# Patient Record
Sex: Female | Born: 1955 | Race: Black or African American | Hispanic: No | Marital: Married | Smoking: Former smoker
Health system: Southern US, Community
[De-identification: ages and names within clinical notes are randomized; demographics above are authoritative.]

## PROBLEM LIST (undated history)

## (undated) DIAGNOSIS — D126 Benign neoplasm of colon, unspecified: Secondary | ICD-10-CM

## (undated) DIAGNOSIS — T7840XA Allergy, unspecified, initial encounter: Secondary | ICD-10-CM

## (undated) DIAGNOSIS — R7301 Impaired fasting glucose: Secondary | ICD-10-CM

## (undated) DIAGNOSIS — I10 Essential (primary) hypertension: Secondary | ICD-10-CM

## (undated) DIAGNOSIS — E785 Hyperlipidemia, unspecified: Secondary | ICD-10-CM

## (undated) HISTORY — DX: Allergy, unspecified, initial encounter: T78.40XA

## (undated) HISTORY — DX: Benign neoplasm of colon, unspecified: D12.6

## (undated) HISTORY — DX: Hyperlipidemia, unspecified: E78.5

## (undated) HISTORY — DX: Impaired fasting glucose: R73.01

## (undated) HISTORY — DX: Essential (primary) hypertension: I10

---

## 1993-12-23 HISTORY — PX: TUBAL LIGATION: SHX77

## 2000-09-26 ENCOUNTER — Encounter: Payer: Self-pay | Admitting: Family Medicine

## 2000-09-26 ENCOUNTER — Encounter: Admission: RE | Admit: 2000-09-26 | Discharge: 2000-09-26 | Payer: Self-pay | Admitting: Family Medicine

## 2003-10-24 ENCOUNTER — Emergency Department (HOSPITAL_COMMUNITY): Admission: EM | Admit: 2003-10-24 | Discharge: 2003-10-25 | Payer: Self-pay | Admitting: Emergency Medicine

## 2005-03-12 ENCOUNTER — Other Ambulatory Visit: Admission: RE | Admit: 2005-03-12 | Discharge: 2005-03-12 | Payer: Self-pay | Admitting: Family Medicine

## 2007-09-30 ENCOUNTER — Other Ambulatory Visit: Admission: RE | Admit: 2007-09-30 | Discharge: 2007-09-30 | Payer: Self-pay | Admitting: Family Medicine

## 2007-09-30 ENCOUNTER — Ambulatory Visit: Payer: Self-pay | Admitting: Family Medicine

## 2007-10-13 ENCOUNTER — Ambulatory Visit: Payer: Self-pay | Admitting: Family Medicine

## 2007-10-14 ENCOUNTER — Ambulatory Visit: Payer: Self-pay | Admitting: Family Medicine

## 2007-11-03 ENCOUNTER — Ambulatory Visit: Payer: Self-pay | Admitting: Family Medicine

## 2009-04-29 IMAGING — US MAMMO-BILAT-US
1 series · 6 of 6 positions shown · non-contrast
Comparison: NONE

CLINICAL DATA: Ayushmaan Beshra.(CHELSIE)(LIEBER)   Diagnostic Mammogram. 

BILATERAL MAMMOGRAM AND RIGHT BREAST ULTRASOUND

[Series 1: us right breast · 0.11mm/px · 6 of 6 slices shown]
[im 1/6]
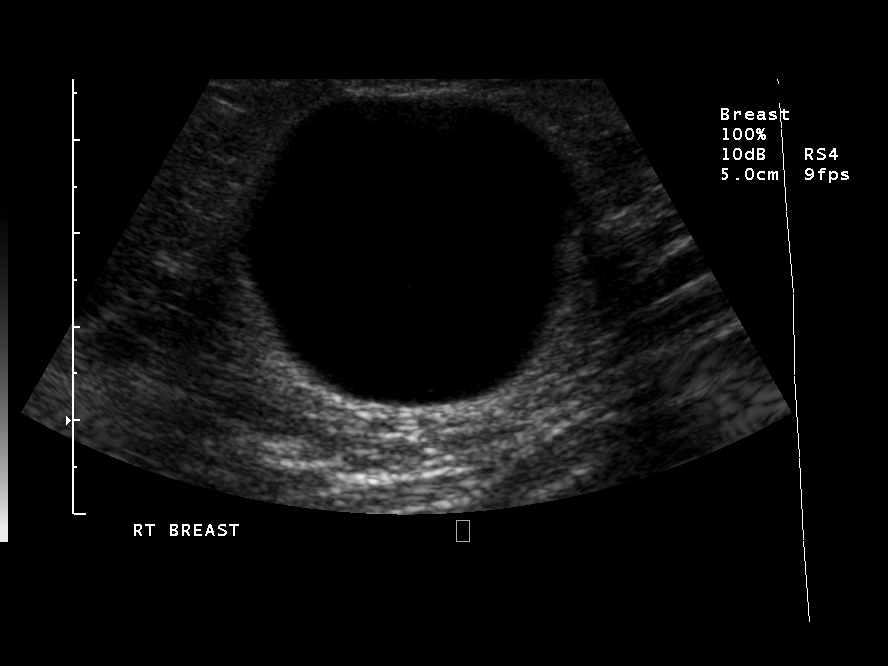
[im 2/6]
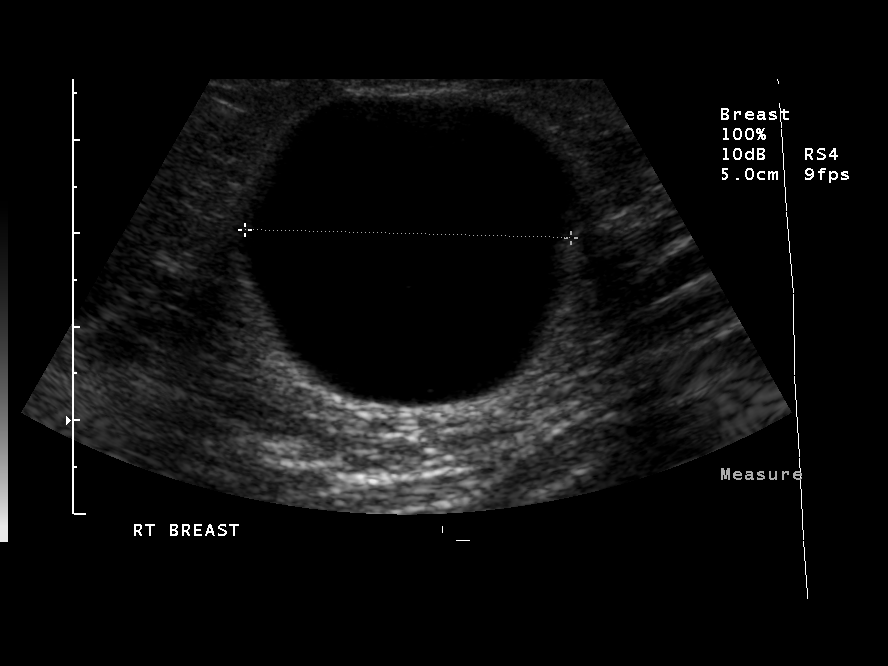
[im 3/6]
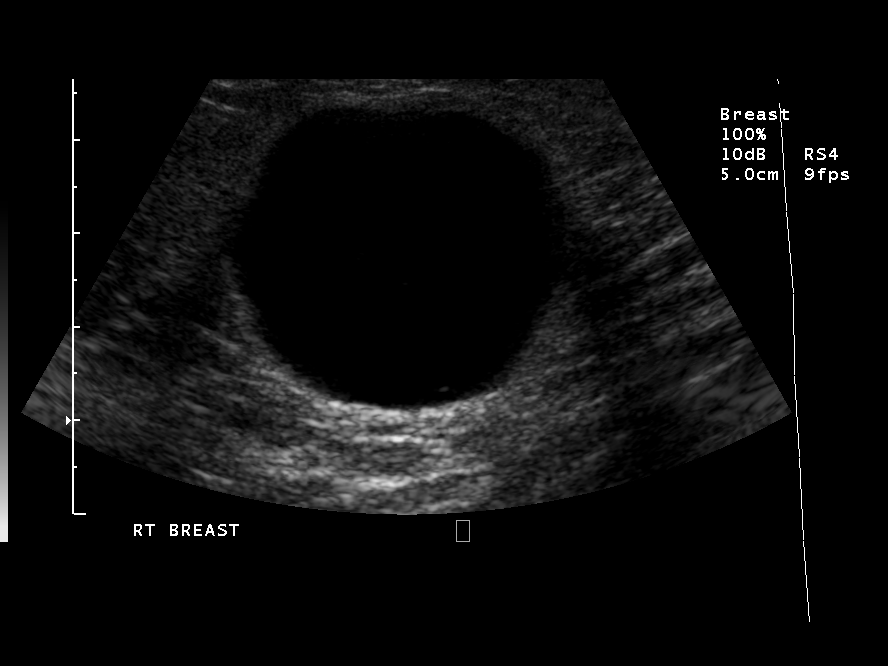
[im 4/6]
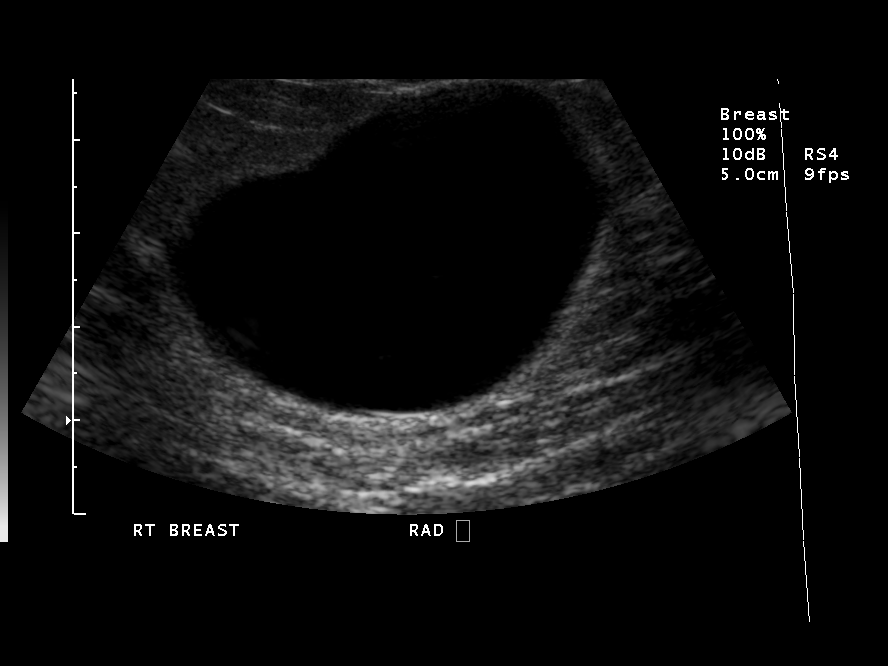
[im 5/6]
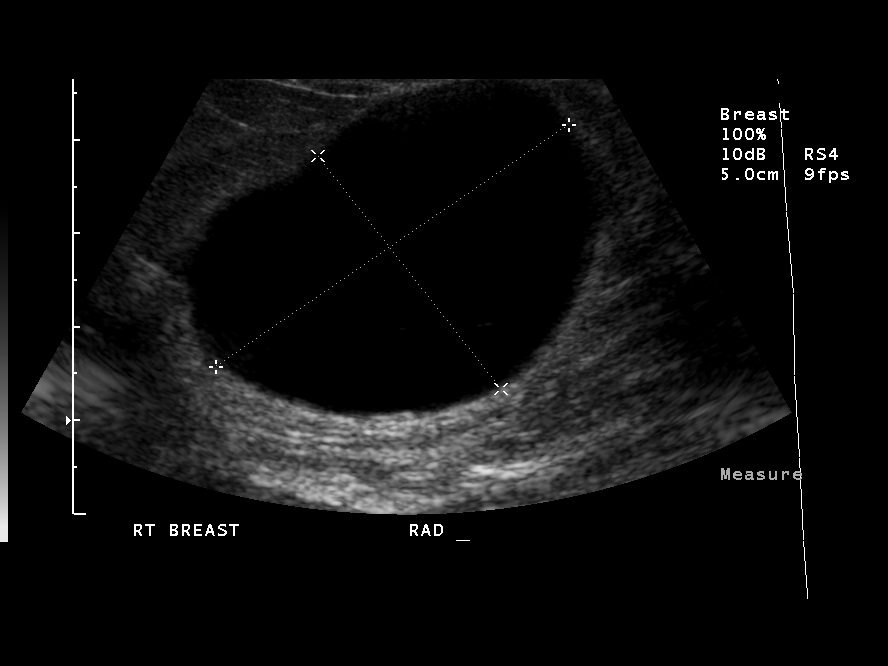
[im 6/6]
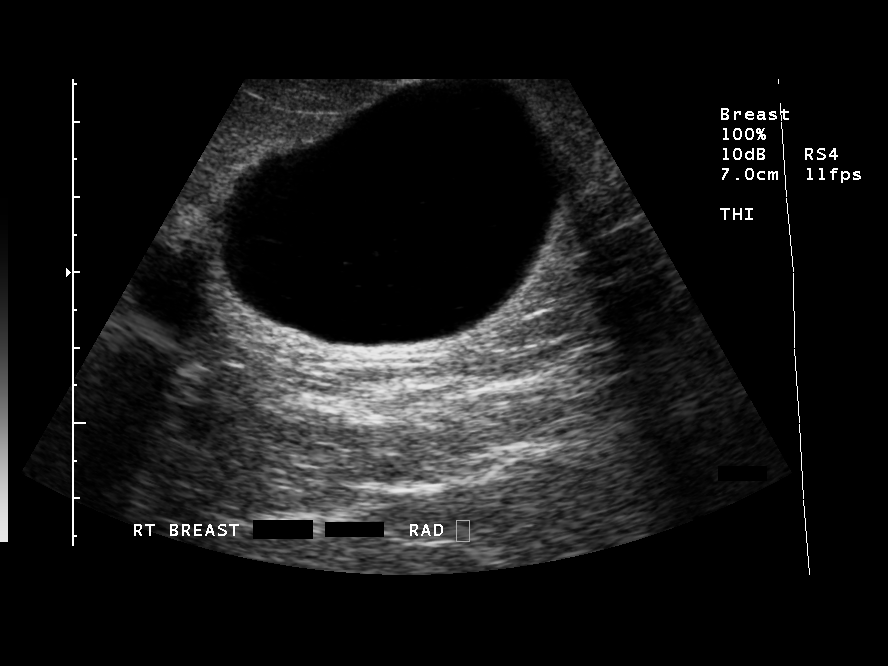

[6 of 6 positions shown; findings below may reference images not displayed]

FINDINGS: There is a large mass in the right breast, increased 
in size from the prior study.  On the current examination this 
lesion measures approximately 4 x 5 cm.  Previously this had a 
maximum diameter of 3.5 cm.  There are multiple bilateral masses, 
varying in size, none of which appear more suspicious at this time 
to warrant biopsy, compared to both 12-02-03 and 12-27-04. Right 
breast ultrasound demonstrates a large cyst measuring 3.2 x 4.6 x 
3.4 cm.  This corresponds to the dominant mass in the right 
breast.
IMPRESSION: Benign changes demonstrated mammographically and by 
ultrasound.  If this lesion continues to enlarge or becomes 
painful, cyst aspiration may aid in further evaluation. The 
patient was informed at the time of the examination of the 
findings and recommendations by verbal and written lay report. 
Computer assisted (Second Look) technology was used as an aid in 
interpretation of this study. BI-RADS 2- Benign Toh, Shakia Date:  10/09/2007 EVA-MAARJA BERGSTEIN

## 2009-06-02 IMAGING — US US MULTISYS EXAM
1 series · 8 of 8 positions shown · non-contrast
Comparison: NONE

CLINICAL DATA: Right breast cyst. 

RIGHT BREAST CYST ASPIRATION

[Series 1: us breast cyst asp · 0.09mm/px · 8 of 8 slices shown]
[im 1/8]
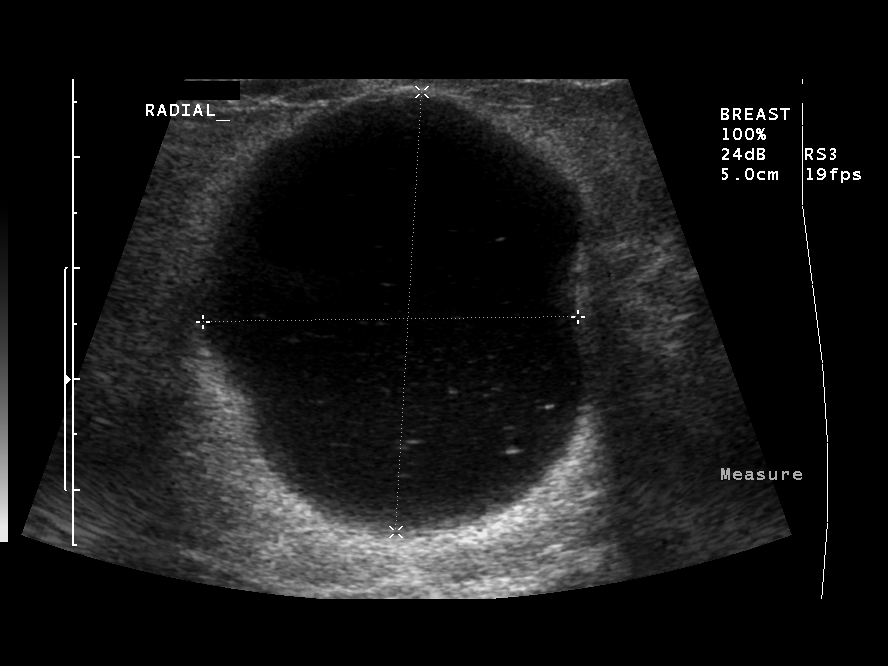
[im 2/8]
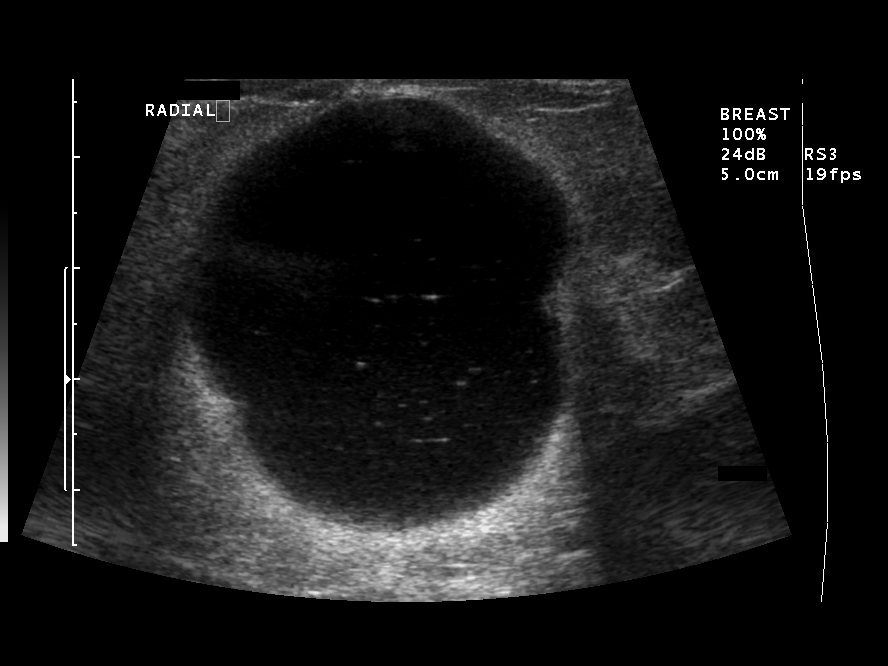
[im 3/8]
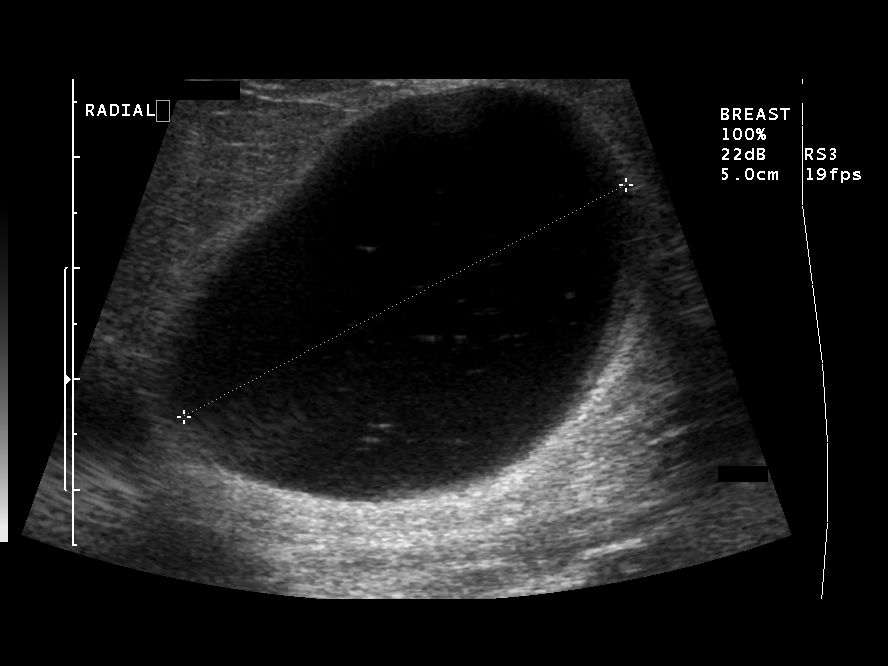
[im 4/8]
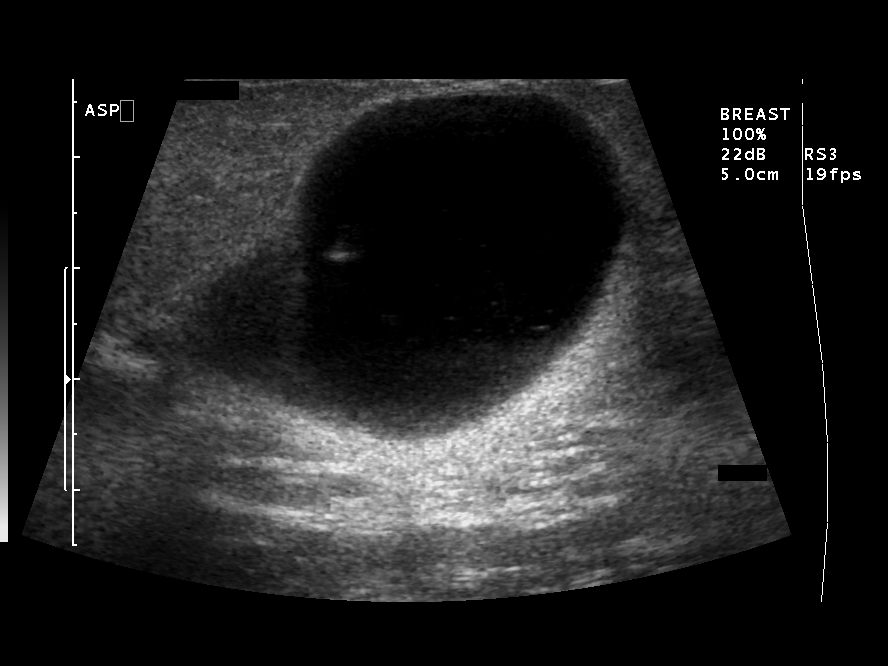
[im 5/8]
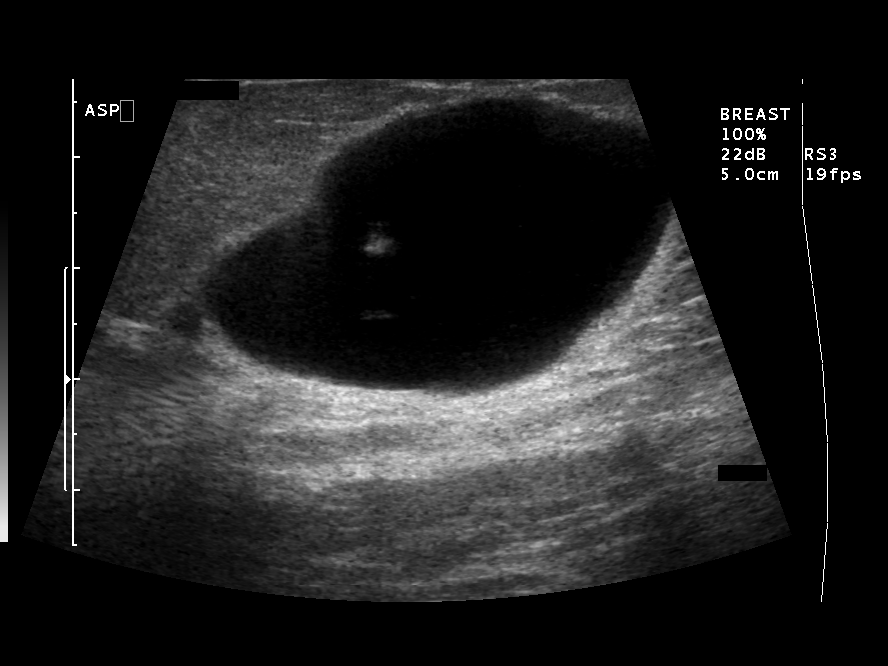
[im 6/8]
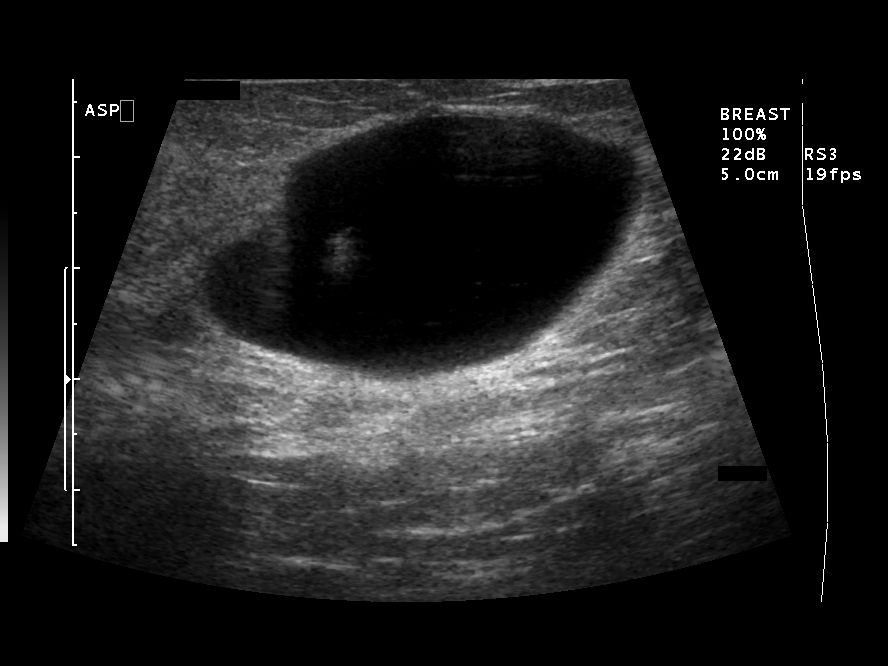
[im 7/8]
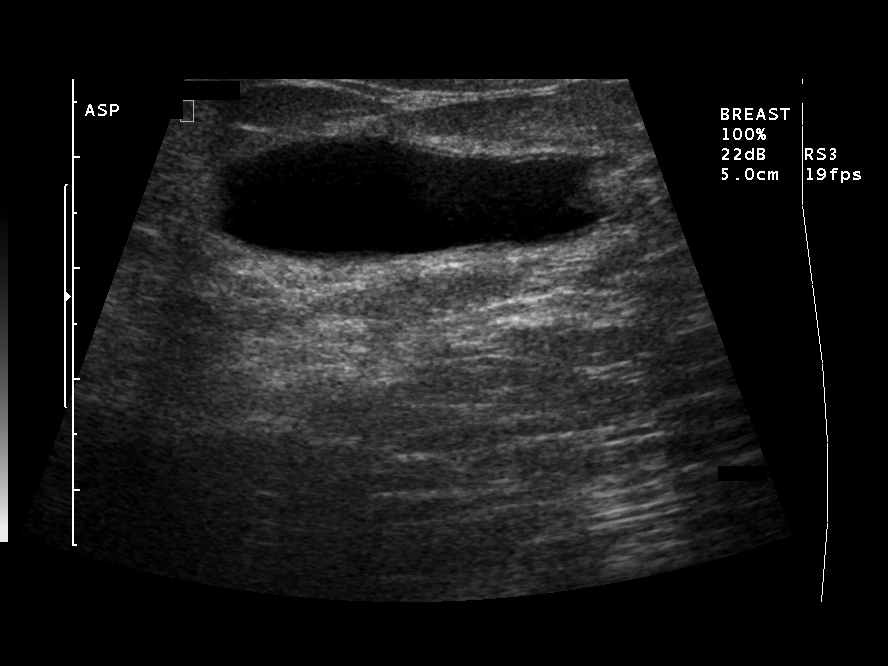
[im 8/8]
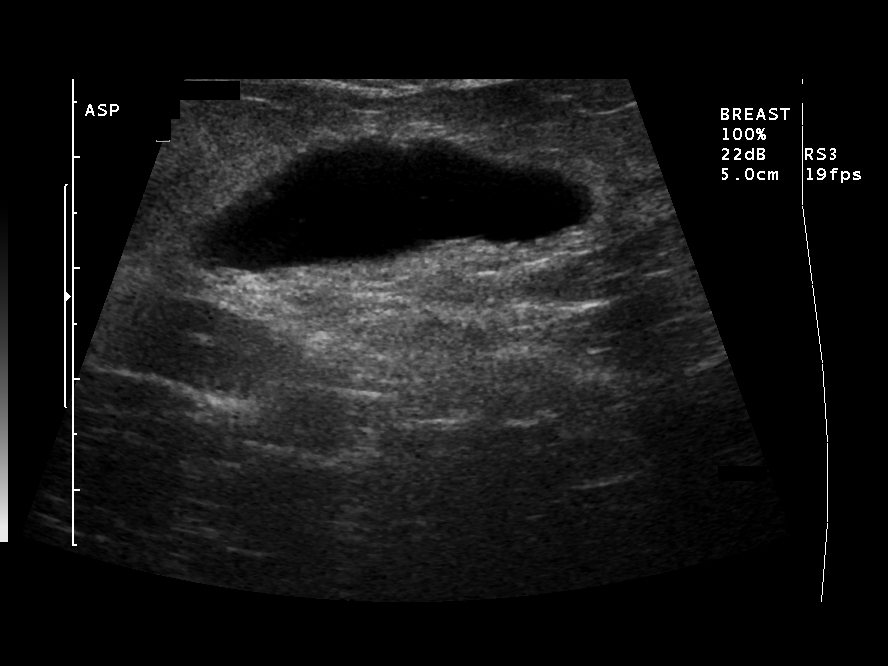

[8 of 8 positions shown; findings below may reference images not displayed]

FINDINGS: The previously identified cyst was again demonstrated 
by ultrasound in the 12 o???clock position of the right breast, 5 cm 
from the nipple. This is readily palpable and measures 3.4 x 4.0 x 
4.5 cm. Low-level dependent echos are consistent with debris. 
Following consent, the skin was cleansed with alcohol wipes, and a 
21-gauge needle was placed within the cyst under ultrasound 
guidance. 20 cc of straw-colored fluid was obtained. There was a 
small amount of residual fluid noted post aspiration. The cyst was 
no longer palpable. There were no complications.
IMPRESSION: Cyst aspiration yielding 20 cc of straw-colored cyst 
compatible fluid. The patient tolerated the procedure well with no 
immediate complications. BI-RADS 2- Benign Norimichi Oami, 
M.D. electronically reviewed on 11/13/2007 Dict Date: 11/11/2007  
Tran Date: 11/13/2007 CAV  [REDACTED]

## 2009-10-12 ENCOUNTER — Ambulatory Visit: Payer: Self-pay | Admitting: Family Medicine

## 2009-10-12 ENCOUNTER — Other Ambulatory Visit: Admission: RE | Admit: 2009-10-12 | Discharge: 2009-10-12 | Payer: Self-pay | Admitting: Family Medicine

## 2009-12-23 DIAGNOSIS — D126 Benign neoplasm of colon, unspecified: Secondary | ICD-10-CM

## 2009-12-23 HISTORY — PX: COLONOSCOPY: SHX174

## 2009-12-23 HISTORY — DX: Benign neoplasm of colon, unspecified: D12.6

## 2009-12-23 LAB — HM COLONOSCOPY: HM Colonoscopy: NORMAL

## 2010-03-14 ENCOUNTER — Ambulatory Visit: Payer: Self-pay | Admitting: Family Medicine

## 2011-01-09 ENCOUNTER — Ambulatory Visit: Admit: 2011-01-09 | Payer: Self-pay | Admitting: Family Medicine

## 2011-01-30 ENCOUNTER — Other Ambulatory Visit: Payer: Self-pay | Admitting: Family Medicine

## 2011-01-30 ENCOUNTER — Encounter (INDEPENDENT_AMBULATORY_CARE_PROVIDER_SITE_OTHER): Payer: BC Managed Care – PPO | Admitting: Physician Assistant

## 2011-01-30 ENCOUNTER — Other Ambulatory Visit (HOSPITAL_COMMUNITY)
Admission: RE | Admit: 2011-01-30 | Discharge: 2011-01-30 | Disposition: A | Discharge status: Home / Self Care | Payer: BC Managed Care – PPO | Source: Ambulatory Visit | Attending: Family Medicine | Admitting: Family Medicine

## 2011-01-30 DIAGNOSIS — Z124 Encounter for screening for malignant neoplasm of cervix: Secondary | ICD-10-CM | POA: Insufficient documentation

## 2011-01-30 DIAGNOSIS — E119 Type 2 diabetes mellitus without complications: Secondary | ICD-10-CM

## 2011-01-30 DIAGNOSIS — Z0000 Encounter for general adult medical examination without abnormal findings: Secondary | ICD-10-CM

## 2011-01-30 DIAGNOSIS — E7800 Pure hypercholesterolemia, unspecified: Secondary | ICD-10-CM

## 2011-01-30 LAB — HM PAP SMEAR: HM Pap smear: NORMAL

## 2011-03-04 ENCOUNTER — Ambulatory Visit (INDEPENDENT_AMBULATORY_CARE_PROVIDER_SITE_OTHER): Payer: BC Managed Care – PPO | Admitting: Family Medicine

## 2011-03-04 DIAGNOSIS — E7800 Pure hypercholesterolemia, unspecified: Secondary | ICD-10-CM

## 2011-03-04 DIAGNOSIS — R030 Elevated blood-pressure reading, without diagnosis of hypertension: Secondary | ICD-10-CM

## 2011-05-23 ENCOUNTER — Encounter: Payer: Self-pay | Admitting: *Deleted

## 2011-05-27 ENCOUNTER — Ambulatory Visit: Payer: BC Managed Care – PPO

## 2011-05-30 ENCOUNTER — Ambulatory Visit: Payer: BC Managed Care – PPO | Admitting: Family Medicine

## 2011-10-25 ENCOUNTER — Telehealth: Payer: Self-pay | Admitting: *Deleted

## 2011-10-25 NOTE — Telephone Encounter (Signed)
Patient dropped off form to be filled out by Dr.Knapp. Called patient this am to let her know that she was last seen 02/2011. She was supposed to follow up in 2-3 months to have BP rechecked(was high at visit) and also needs a statin panel(lipid,liver) either prior to appointment, if she is unable than she can just schedule a fasting appt. Message left.

## 2011-11-04 ENCOUNTER — Encounter: Payer: Self-pay | Admitting: Family Medicine

## 2011-11-04 ENCOUNTER — Ambulatory Visit (INDEPENDENT_AMBULATORY_CARE_PROVIDER_SITE_OTHER): Payer: BC Managed Care – PPO | Admitting: Family Medicine

## 2011-11-04 DIAGNOSIS — R030 Elevated blood-pressure reading, without diagnosis of hypertension: Secondary | ICD-10-CM

## 2011-11-04 DIAGNOSIS — E119 Type 2 diabetes mellitus without complications: Secondary | ICD-10-CM | POA: Insufficient documentation

## 2011-11-04 DIAGNOSIS — E7800 Pure hypercholesterolemia, unspecified: Secondary | ICD-10-CM

## 2011-11-04 DIAGNOSIS — IMO0001 Reserved for inherently not codable concepts without codable children: Secondary | ICD-10-CM | POA: Insufficient documentation

## 2011-11-04 LAB — COMPREHENSIVE METABOLIC PANEL
ALT: 15 U/L (ref 0–35)
AST: 16 U/L (ref 0–37)
Albumin: 4.1 g/dL (ref 3.5–5.2)
Alkaline Phosphatase: 83 U/L (ref 39–117)
BUN: 15 mg/dL (ref 6–23)
CO2: 26 mEq/L (ref 19–32)
Calcium: 9.6 mg/dL (ref 8.4–10.5)
Chloride: 105 mEq/L (ref 96–112)
Creat: 0.96 mg/dL (ref 0.50–1.10)
Glucose, Bld: 96 mg/dL (ref 70–99)
Potassium: 4 mEq/L (ref 3.5–5.3)
Sodium: 140 mEq/L (ref 135–145)
Total Bilirubin: 0.4 mg/dL (ref 0.3–1.2)
Total Protein: 6.7 g/dL (ref 6.0–8.3)

## 2011-11-04 LAB — LIPID PANEL
Cholesterol: 192 mg/dL (ref 0–200)
HDL: 55 mg/dL (ref >39)
LDL Cholesterol: 121 mg/dL — ABNORMAL HIGH (ref 0–99)
Total CHOL/HDL Ratio: 3.5 Ratio
Triglycerides: 81 mg/dL (ref <150)
VLDL: 16 mg/dL (ref 0–40)

## 2011-11-04 NOTE — Patient Instructions (Signed)
Add some additional cardio--goal of 30-60 minutes of aerobic activity 5-6 days/week.  Low sodium diet:  2 Gram Low Sodium Diet A 2 gram sodium diet restricts the amount of sodium in the diet to no more than 2 g or 2000 mg daily. Limiting the amount of sodium is often used to help lower blood pressure. It is important if you have heart, liver, or kidney problems. Many foods contain sodium for flavor and sometimes as a preservative. When the amount of sodium in a diet needs to be low, it is important to know what to look for when choosing foods and drinks. The following includes some information and guidelines to help make it easier for you to adapt to a low sodium diet. QUICK TIPS  Do not add salt to food.   Avoid convenience items and fast food.   Choose unsalted snack foods.   Buy lower sodium products, often labeled as "lower sodium" or "no salt added."   Check food labels to learn how much sodium is in 1 serving.   When eating at a restaurant, ask that your food be prepared with less salt or none, if possible.  READING FOOD LABELS FOR SODIUM INFORMATION The nutrition facts label is a good place to find how much sodium is in foods. Look for products with no more than 500 to 600 mg of sodium per meal and no more than 150 mg per serving. Remember that 2 g = 2000 mg. The food label may also list foods as:  Sodium-free: Less than 5 mg in a serving.   Very low sodium: 35 mg or less in a serving.   Low-sodium: 140 mg or less in a serving.   Light in sodium: 50% less sodium in a serving. For example, if a food that usually has 300 mg of sodium is changed to become light in sodium, it will have 150 mg of sodium.   Reduced sodium: 25% less sodium in a serving. For example, if a food that usually has 400 mg of sodium is changed to reduced sodium, it will have 300 mg of sodium.  CHOOSING FOODS Grains  Avoid: Salted crackers and snack items. Some cereals, including instant hot cereals.  Bread stuffing and biscuit mixes. Seasoned rice or pasta mixes.   Choose: Unsalted snack items. Low-sodium cereals, oats, puffed wheat and rice, shredded wheat. English muffins and bread. Pasta.  Meats  Avoid: Salted, canned, smoked, spiced, pickled meats, including fish and poultry. Bacon, ham, sausage, cold cuts, hot dogs, anchovies.   Choose: Low-sodium canned tuna and salmon. Fresh or frozen meat, poultry, and fish.  Dairy  Avoid: Processed cheese and spreads. Cottage cheese. Buttermilk and condensed milk. Regular cheese.   Choose: Milk. Low-sodium cottage cheese. Yogurt. Sour cream. Low-sodium cheese.  Fruits and Vegetables  Avoid: Regular canned vegetables. Regular canned tomato sauce and paste. Frozen vegetables in sauces. Olives. Rosita Fire. Relishes. Sauerkraut.   Choose: Low-sodium canned vegetables. Low-sodium tomato sauce and paste. Frozen or fresh vegetables. Fresh and frozen fruit.  Condiments  Avoid: Canned and packaged gravies. Worcestershire sauce. Tartar sauce. Barbecue sauce. Soy sauce. Steak sauce. Ketchup. Onion, garlic, and table salt. Meat flavorings and tenderizers.   Choose: Fresh and dried herbs and spices. Low-sodium varieties of mustard and ketchup. Lemon juice. Tabasco sauce. Horseradish.  SAMPLE 2 GRAM SODIUM MEAL PLAN Breakfast / Sodium (mg)  1 cup low-fat milk / 143 mg   2 slices whole-wheat toast / 270 mg   1 tbs heart-healthy margarine /  153 mg   1 hard-boiled egg / 139 mg   1 small orange / 0 mg  Lunch / Sodium (mg)  1 cup raw carrots / 76 mg    cup hummus / 298 mg   1 cup low-fat milk / 143 mg    cup red grapes / 2 mg   1 whole-wheat pita bread / 356 mg  Dinner / Sodium (mg)  1 cup whole-wheat pasta / 2 mg   1 cup low-sodium tomato sauce / 73 mg   3 oz lean ground beef / 57 mg   1 small side salad (1 cup raw spinach leaves,  cup cucumber,  cup yellow bell pepper) with 1 tsp olive oil and 1 tsp red wine vinegar / 25 mg  Snack  / Sodium (mg)  1 container low-fat vanilla yogurt / 107 mg   3 graham cracker squares / 127 mg  Nutrient Analysis  Calories: 2033   Protein: 77 g   Carbohydrate: 282 g   Fat: 72 g   Sodium: 1971 mg  Document Released: 12/09/2005 Document Revised: 08/21/2011 Document Reviewed: 03/12/2010 [facility] Patient Information 2012 [place], [place].

## 2011-11-04 NOTE — Progress Notes (Signed)
Patient presents to have form completed for work.  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  Last PPD was 2008.  Form doesn't state how often this needs to be tested.  She had elevated BP at last visit.  Checks BP periodically at the [facility].  After exercise, her BP runs 118/82, but usually running 130-140/87 before she works out.  Denies headaches, chest pain, edema (only if she travels).  Admits to being noncompliant with taking her Lipitor.  She has been taking it consistently now for 3 weeks, but prior to that was very inconsistent (taking some days, not others, forgetting when out of town--averaged about 50% of the time taking it).  Denies side effect of medications.  She is fasting today for labs.  She is exercising 4 days/week, and although hasn't lost weight, she has lost inches--now wearing pants a size smaller, and feels like she's lost inches everywhere (not just waist).  She is lifting weights twice a week, and Zumba twice a week.  Past Medical History  Diagnosis Date  . Allergy   . Hyperlipidemia   . Impaired fasting glucose   . Diabetes mellitus 09/2009  . Adenomatous colon polyp 12/2009  . Hypertension borderline bp's/white coat syndrome    Past Surgical History  Procedure Date  . Tubal ligation 1995  . Colonoscopy 12/2009    History   Social History  . Marital Status: Married    Spouse Name: N/A    Number of Children: 2  . Years of Education: N/A   Occupational History  . foster parent    Social History Main Topics  . Smoking status: Former Smoker    Types: Cigarettes    Quit date: 12/24/1991  . Smokeless tobacco: Not on file  . Alcohol Use: Yes     1 drink eery 6 months.  . Drug Use: No  . Sexually Active: Not on file   Other Topics Concern  . Not on file   Social History Narrative   Retired Interior and spatial designer.  Foster parent    No family history on file.  Current outpatient prescriptions:atorvastatin (LIPITOR) 20 MG tablet, Take 20 mg  by mouth daily.  , Disp: , Rfl: ;  Calcium Carbonate-Vitamin D (CALCIUM 500 + D PO), Take 1 tablet by mouth daily.  , Disp: , Rfl: ;  fish oil-omega-3 fatty acids 1000 MG capsule, Take 1 g by mouth daily.  , Disp: , Rfl: ;  Misc Natural Products (PROGESTERONE) 1000 MG/60GM CREA, Apply 1 application topically daily.  , Disp: , Rfl:  Vitamins C E (VITAMIN C & E COMBINATION) 500-400 MG-UNIT CAPS, Take 2 tablets by mouth daily.  , Disp: , Rfl:   Allergies  Allergen Reactions  . Codeine Itching and Other (See Comments)    aggitation.   ROS:  Denies headaches, chest pain, shortness of breath, edema, URI symptoms, fever, GI complaints, GU complaints, joint or muscle pains, skin rash, depression, anxiety or other concerns  PHYSICAL EXAM: BP 146/88  Pulse 76  Ht 5\' 3"  (1.6 m)  Wt 219 lb (99.338 kg)  BMI 38.79 kg/m2 Well developed, pleasant female in no distress HEENT: PERRL, EOMI, conjunctiva clear Neck: no lymphadenopathy or thyromegaly Heart: regular rate and rhythm without murmur Lungs: clear bilaterally Abdomen: soft, nontender Extremities: no edema, 2+ pulses Psych: normal mood, affect, hygiene and grooming  ASSESSMENT/PLAN:  1. Pure hypercholesterolemia  Comprehensive metabolic panel, Lipid panel  2. Type II or unspecified type diabetes mellitus without  mention of complication, not stated as uncontrolled  Comprehensive metabolic panel, Hemoglobin A1c  3. Elevated BP     Hyperlipidemia: noncompliant with meds, only compliant x 3 weeks.  LDL goal <100 due to DM (which is diet controlled).  I don't expect it to be at goal--if it is close (ie 110), then continue 20mg ; if >130, will need dose adjustment.  Not seeing full effect of the 20mg  due to inconsistency in taking  Declines flu shot--strongly encouraged today.  Borderline blood pressure--appears to have white coat component.  Numbers before exercise are borderline, much improved after exercise.  EKG was done at last visit, and no LVH  was found.  Continue with diet/exercise, low sodium diet.  Consider ambulatory BP monitor in future if still borderline.  F/u will depend on lab results--either 3-6 months.  Form was filled out, putting in 2008 PPD results.  She will return for nurse visit if she finds out she needs current one.

## 2011-11-07 ENCOUNTER — Other Ambulatory Visit: Payer: Self-pay | Admitting: *Deleted

## 2011-11-07 DIAGNOSIS — E7800 Pure hypercholesterolemia, unspecified: Secondary | ICD-10-CM

## 2011-12-10 ENCOUNTER — Encounter: Payer: Self-pay | Admitting: Medical

## 2011-12-10 ENCOUNTER — Ambulatory Visit (INDEPENDENT_AMBULATORY_CARE_PROVIDER_SITE_OTHER): Payer: BC Managed Care – PPO | Admitting: Medical

## 2011-12-10 DIAGNOSIS — J111 Influenza due to unidentified influenza virus with other respiratory manifestations: Secondary | ICD-10-CM | POA: Insufficient documentation

## 2011-12-10 MED ORDER — HYDROCODONE-HOMATROPINE 5-1.5 MG/5ML PO SYRP: 5.0000 mL | ORAL_SOLUTION | Freq: Four times a day (QID) | ORAL | Status: AC | PRN

## 2011-12-10 NOTE — Patient Instructions (Signed)
Influenza, Adult Influenza ("the flu") is a viral infection of the respiratory tract. It causes chills, fever, cough, headache, body aches, and sore throat. Influenza in general will make you feel sicker than when you have a common cold. Symptoms of the illness typically last a few days. Cough and fatigue may continue for as long as 7 to 10 days. Influenza is highly contagious. It spreads easily to others in the droplets from coughs and sneezes. People frequently become infected by touching something that was recently contaminated with the virus and then touch their mouth, nose or eyes. This infection is caused by a virus. Symptoms will not be reduced or improved by taking an antibiotic. Antibiotics are medications that kill bacteria, not viruses. DIAGNOSIS  Diagnosis of influenza is often made based on the history and physical examination as well as the presence of influenza reports occurring in your community. Testing can be done if the diagnosis is not certain. TREATMENT  Since influenza is caused by a virus, antibiotics are not helpful. Your caregiver may prescribe antiviral medicines to shorten the illness and lessen the severity. Your caregiver may also recommend influenza vaccination and/or antiviral medicines for your family members in order to prevent the spread of influenza to them. HOME CARE INSTRUCTIONS  DO NOT GIVE ASPIRIN TO PERSONS WITH INFLUENZA WHO ARE UNDER 18 YEARS OF AGE. This could lead to brain and liver damage (Reye's syndrome). Read the label on over-the-counter medicines.   Stay home from work or school if at all possible until most of your symptoms are gone.   Only take over-the-counter or prescription medicines for pain, discomfort, or fever as directed by your caregiver.   Use a cool mist humidifier to increase air moisture. This will make breathing easier.   Rest until your temperature is nearly normal: 98.6 F (37 C). This usually takes 3 to 4 days. Be sure you get  plenty of rest.   Drink at least eight, eight-ounce glasses of fluids per day. Fluids include water, juice, broth, gelatin, or lemonade.   Cover your mouth and nose when coughing or sneezing and wash your hands often to prevent the spread of this virus to other persons.  PREVENTION  Annual influenza vaccination (flu shots) is the best way to avoid getting influenza. An annual flu shot is now routinely recommended for all adults in the [place] SEEK MEDICAL CARE IF:   You develop shortness of breath while resting.   You have a deep cough with production of mucous or chest pain.   You develop nausea (feeling sick to your stomach), vomiting, or diarrhea.  SEEK IMMEDIATE MEDICAL CARE IF:   You have difficulty breathing, become short of breath, or your skin or nails turn bluish.   You develop severe neck pain or stiffness.   You develop a severe headache, facial pain, or earache.   You have a fever.   You develop nausea or vomiting that cannot be controlled.  Document Released: 12/06/2000 Document Revised: 08/21/2011 Document Reviewed: 10/11/2009 [employer] Patient Information 2012 [employer]. 

## 2011-12-10 NOTE — Progress Notes (Signed)
Subjective:   HPI  Katelyn Andrews is a 55 y.o. female who presents with 2 day hx/o cough, sore throat, body aches, chills, fever over 102, no appetite.  +sick contacts.  She notes bad headache.  Denies nausea, vomiting, diarrhea. Drinking lots of fluids, resting.  Using Nyquil and Dayquil.  Denies hx/o asthma.  No other aggravating or relieving factors.  She did not get a flu shot this year.  No other c/o.  The following portions of the patient's history were reviewed and updated as appropriate: allergies, current medications, past family history, past medical history, past social history, past surgical history and problem list.  Past Medical History  Diagnosis Date  . Allergy   . Hyperlipidemia   . Impaired fasting glucose   . Diabetes mellitus 09/2009  . Adenomatous colon polyp 12/2009  . Hypertension borderline bp's/white coat syndrome    Review of Systems Constitutional: +fever, +chills, +sweats, -unexpected -weight change,-fatigue ENT: +runny nose, -ear pain, +sore throat Cardiology:  chest pain, -palpitations, -edema Respiratory: +cough, -shortness of breath, +wheezing Gastroenterology: -abdominal pain, -nausea, -vomiting, -diarrhea, -constipation Hematology: -bleeding or bruising problems Musculoskeletal: -arthralgias, -myalgias, -joint swelling, -back pain Ophthalmology: -vision changes Urology: -dysuria, -difficulty urinating, -hematuria, -urinary frequency, -urgency Neurology: +headache, +weakness, -tingling, -numbness   Objective:     General: Ill-appearing, well-developed, well-nourished, ill appearing Skin: Hot, moist HEENT: Nose inflamed and congested, clear conjunctiva, TMs pearly, mild sinus tenderness, pharynx with erythema, no exudates Neck: Supple, nontender, shotty cervical adenopathy Heart: Regular rate and rhythm, normal S1, S2, no murmurs Lungs: Clear to auscultation bilaterally, no wheezes, rales, rhonchi Abdomen: Nontender non distended Extremities:  Mild generalized tenderness      Assessment and Plan:    Encounter Diagnosis  Name Primary?  . Influenza Yes   Exam and symptoms suggest influenza given community prevalence at current.  Discussed supportive care, rest, hydration, Ibuprofen for fever/aches, salt water gargles and nasal saline, discussed prevention, possible complications.  Gave note for work to be out through Friday possibly if not improved and if fever persists.   Advised flu shot next year.

## 2012-05-26 ENCOUNTER — Encounter: Payer: Self-pay | Admitting: Family Medicine

## 2012-05-26 ENCOUNTER — Ambulatory Visit (INDEPENDENT_AMBULATORY_CARE_PROVIDER_SITE_OTHER): Payer: BC Managed Care – PPO | Admitting: Family Medicine

## 2012-05-26 VITALS — BP 136/86 | HR 68 | Wt 216.0 lb

## 2012-05-26 DIAGNOSIS — H8309 Labyrinthitis, unspecified ear: Secondary | ICD-10-CM

## 2012-05-26 NOTE — Patient Instructions (Signed)
The symptoms should slowly go away over the next 2 weeks but if you have further trouble, call me

## 2012-05-26 NOTE — Progress Notes (Signed)
  Subjective:    Patient ID: Katelyn Andrews, female    DOB: 12/16/1956, 56 y.o.   MRN: [id number]  HPI She has a two-week history of difficulty with intermittent brief thinning sensation. It usually lasts just a second or two. She notices this especially when she tips her head back or to the left. She did have URI symptoms approximately 2 weeks before the symptoms started. Presently she has not had fever, chills, cough, congestion, sore throat.   Review of Systems     Objective:   Physical Exam alert and in no distress. EOMI. Cerebellar testing normal. Tympanic membranes and canals are normal. Throat is clear. Tonsils are normal. Neck is supple without adenopathy or thyromegaly. Cardiac exam shows a regular sinus rhythm without murmurs or gallops. Lungs are clear to auscultation.        Assessment & Plan:   1. Viral labyrinthitis    I reassured her that this was nothing significant. Did not recommend any medications since she is having only short bursts of dizziness. She will call if she has further trouble.

## 2013-03-05 ENCOUNTER — Encounter: Payer: Self-pay | Admitting: Internal Medicine

## 2013-03-17 ENCOUNTER — Encounter: Payer: BC Managed Care – PPO | Admitting: Family Medicine

## 2013-03-17 ENCOUNTER — Ambulatory Visit (INDEPENDENT_AMBULATORY_CARE_PROVIDER_SITE_OTHER): Payer: BC Managed Care – PPO | Admitting: Family Medicine

## 2013-03-17 ENCOUNTER — Encounter: Payer: Self-pay | Admitting: Family Medicine

## 2013-03-17 VITALS — BP 108/72 | HR 64 | Ht 64.0 in | Wt 224.0 lb

## 2013-03-17 DIAGNOSIS — E119 Type 2 diabetes mellitus without complications: Secondary | ICD-10-CM

## 2013-03-17 DIAGNOSIS — Z0000 Encounter for general adult medical examination without abnormal findings: Secondary | ICD-10-CM

## 2013-03-17 DIAGNOSIS — E7800 Pure hypercholesterolemia, unspecified: Secondary | ICD-10-CM

## 2013-03-17 DIAGNOSIS — R5381 Other malaise: Secondary | ICD-10-CM

## 2013-03-17 DIAGNOSIS — Z23 Encounter for immunization: Secondary | ICD-10-CM

## 2013-03-17 LAB — POCT URINALYSIS DIPSTICK
Glucose, UA: NEGATIVE
Ketones, UA: NEGATIVE
Leukocytes, UA: NEGATIVE
Spec Grav, UA: <=1.005
Urobilinogen, UA: NEGATIVE

## 2013-03-17 LAB — COMPREHENSIVE METABOLIC PANEL
ALT: 15 U/L (ref 0–35)
AST: 19 U/L (ref 0–37)
CO2: 30 mEq/L (ref 19–32)
Chloride: 104 mEq/L (ref 96–112)
Creat: 0.97 mg/dL (ref 0.50–1.10)
Sodium: 142 mEq/L (ref 135–145)
Total Bilirubin: 0.4 mg/dL (ref 0.3–1.2)
Total Protein: 6.7 g/dL (ref 6.0–8.3)

## 2013-03-17 LAB — CBC WITH DIFFERENTIAL/PLATELET
Basophils Absolute: 0 10*3/uL (ref 0.0–0.1)
Basophils Relative: 0 % (ref 0–1)
MCHC: 34.1 g/dL (ref 30.0–36.0)
Neutro Abs: 2.2 10*3/uL (ref 1.7–7.7)
Neutrophils Relative %: 33 % — ABNORMAL LOW (ref 43–77)
RDW: 14.8 % (ref 11.5–15.5)
WBC: 6.6 10*3/uL (ref 4.0–10.5)

## 2013-03-17 LAB — LIPID PANEL
Cholesterol: 283 mg/dL — ABNORMAL HIGH (ref 0–200)
HDL: 60 mg/dL (ref >39)
Triglycerides: 113 mg/dL (ref <150)
VLDL: 23 mg/dL (ref 0–40)

## 2013-03-17 LAB — TSH: TSH: 2.532 u[IU]/mL (ref 0.350–4.500)

## 2013-03-17 NOTE — Patient Instructions (Addendum)
HEALTH MAINTENANCE RECOMMENDATIONS:  It is recommended that you get at least 30 minutes of aerobic exercise at least 5 days/week (for weight loss, you may need as much as 60-90 minutes). This can be any activity that gets your heart rate up. This can be divided in 10-15 minute intervals if needed, but try and build up your endurance at least once a week.  Weight bearing exercise is also recommended twice weekly.  Eat a healthy diet with lots of vegetables, fruits and fiber.  "Colorful" foods have a lot of vitamins (ie Natzke vegetables, tomatoes, red peppers, etc).  Limit sweet tea, regular sodas and alcoholic beverages, all of which has a lot of calories and sugar.  Up to 1 alcoholic drink daily may be beneficial for women (unless trying to lose weight, watch sugars).  Drink a lot of water.  Calcium recommendations are 1200-1500 mg daily (1500 mg for postmenopausal women or women without ovaries), and vitamin D 1000 IU daily.  This should be obtained from diet and/or supplements (vitamins), and calcium should not be taken all at once, but in divided doses.  Monthly self breast exams and yearly mammograms for women over the age of 4 is recommended.  Sunscreen of at least SPF 30 should be used on all sun-exposed parts of the skin when outside between the hours of 10 am and 4 pm (not just when at beach or pool, but even with exercise, golf, tennis, and yard work!)  Use a sunscreen that says "broad spectrum" so it covers both UVA and UVB rays, and make sure to reapply every 1-2 hours.  Remember to change the batteries in your smoke detectors when changing your clock times in the spring and fall.  Use your seat belt every time you are in a car, and please drive safely and not be distracted with cell phones and texting while driving.  Remember to get eye exams yearly due to diabetes (even though is just diet-controlled). Schedule your mammogram Colonoscopy due again 2016

## 2013-03-17 NOTE — Progress Notes (Signed)
Chief Complaint  Patient presents with  . Annual Exam    fasting annual exam with pap. No major concerns.    Katelyn Andrews is a 57 y.o. female who presents for a complete physical.  She has the following concerns:  Checks BP's periodically, running 120/80's. Denies headaches or dizziness. Hyperlipidemia--has been off lipitor for quite a few months--told there was a recall by pharmacy (??), reportedly still had refills left.  Following lowfat, low cholesterol diet. DM--diet controlled, never on medication.  Watching carbs/sweets in her diet.  Last ophtho was 2 years ago.    Immunization History  Administered Date(s) Administered  . PPD Test 08/06/2001, 10/13/2007  . Td 04/25/1995, 03/12/2005   Last Pap smear: 2012 Last mammogram: 2012 Last colonoscopy: 2011 Last DEXA: never Ophtho: 2 years ago Dentist: yearly Exercise:  3-4 x/week, walking and zumba, water aerobics  Past Medical History  Diagnosis Date  . Allergy   . Hyperlipidemia   . Impaired fasting glucose   . Diabetes mellitus 09/2009  . Adenomatous colon polyp 12/2009  . Hypertension borderline bp's/white coat syndrome    Past Surgical History  Procedure Laterality Date  . Tubal ligation  1995  . Colonoscopy  12/2009    History   Social History  . Marital Status: Married    Spouse Name: N/A    Number of Children: 2  . Years of Education: N/A   Occupational History  . 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 Smoking status: Former Smoker    Types: Cigarettes    Quit date: 12/24/1991  . Smokeless tobacco: Not on file  . Alcohol Use: Yes     Comment: 1-2 drinks every few months  . Drug Use: No  . Sexually Active: Yes -- Female partner(s)   Other Topics Concern  . 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  Lives with her husband.  Daughter lives in  [place], son in college in [place]    Family History  Problem Relation Age of Onset  . Cancer Maternal Aunt     breast cancer  . Diabetes Maternal Grandmother   . Diabetes Maternal Grandfather     Current outpatient prescriptions:Calcium Carbonate-Vitamin D (CALCIUM 500 + D PO), Take 1 tablet by mouth daily.  , Disp: , Rfl: ;  fish oil-omega-3 fatty acids 1000 MG capsule, Take 1 g by mouth daily.  , Disp: , Rfl: ;  Misc Natural Products (PROGESTERONE) 1000 MG/60GM CREA, Apply 1 application topically daily.  , Disp: , Rfl: ;  Naproxen Sodium (ALEVE PO), Take 2 tablets by mouth as needed., Disp: , Rfl:  Vitamins C E (VITAMIN C & E COMBINATION) 500-400 MG-UNIT CAPS, Take 2 tablets by mouth daily.  , Disp: , Rfl: ;  atorvastatin (LIPITOR) 20 MG tablet, Take 20 mg by mouth daily.  , Disp: , Rfl:   Allergies  Allergen Reactions  . Codeine Itching and Other (See Comments)    aggitation.   ROS:  The patient denies anorexia, fever, headaches,  vision changes, decreased hearing, ear pain, sore throat, breast concerns, chest pain, palpitations, dizziness, syncope, dyspnea on exertion, cough, swelling, nausea, vomiting, diarrhea, constipation, abdominal pain, melena, hematochezia, indigestion/heartburn, hematuria, incontinence, dysuria, vaginal discharge, odor or itch, genital lesions, joint pains, numbness, tingling, weakness, tremor, suspicious skin lesions, depression, anxiety, abnormal bleeding/bruising, or enlarged  lymph nodes.  Slight weight gain--vacations, school, drank a little more beer recently. Postmenopausal (7 years ago)--denies bleeding, spotting.  Hot flashes and vaginal dryness have improved Occasional mild hand pain (relates to doing hair), just intermittent.  PHYSICAL EXAM: BP 108/72  Pulse 64  Ht 5\' 4"  (1.626 m)  Wt 224 lb (101.606 kg)  BMI 38.43 kg/m2  General Appearance:    Alert, cooperative, no distress, appears stated age  Head:    Normocephalic, without obvious  abnormality, atraumatic  Eyes:    PERRL, conjunctiva/corneas clear, EOM's intact, fundi    benign  Ears:    Normal TM's and external ear canals  Nose:   Nares normal, mucosa normal, no drainage or sinus   tenderness  Throat:   Lips, mucosa, and tongue normal; teeth and gums normal  Neck:   Supple, no lymphadenopathy;  thyroid:  no   enlargement/tenderness/nodules; no carotid   bruit or JVD  Back:    Spine nontender, no curvature, ROM normal, no CVA     tenderness  Lungs:     Clear to auscultation bilaterally without wheezes, rales or     ronchi; respirations unlabored  Chest Wall:    No tenderness or deformity   Heart:    Regular rate and rhythm, S1 and S2 normal, no murmur, rub   or gallop  Breast Exam:    No tenderness, masses, or nipple discharge or inversion.      No axillary lymphadenopathy  Abdomen:     Soft, non-tender, nondistended, normoactive bowel sounds,    no masses, no hepatosplenomegaly  Genitalia:    Normal external genitalia without lesions.  BUS and vagina normal; no cervical motion tenderness. No abnormal vaginal discharge.  Uterus and adnexa not enlarged, nontender, no masses.  Pap not performed  Rectal:    Normal tone, no masses or tenderness; guaiac negative stool  Extremities:   No clubbing, cyanosis or edema  Pulses:   2+ and symmetric all extremities  Skin:   Skin color, texture, turgor normal, no rashes or lesions  Lymph nodes:   Cervical, supraclavicular, and axillary nodes normal  Neurologic:   CNII-XII intact, normal strength, sensation and gait; reflexes 2+ and symmetric throughout          Psych:   Normal mood, affect, hygiene and grooming.    ASSESSMENT/PLAN:  Routine general medical examination at a health care facility - Plan: Visual acuity screening, POCT Urinalysis Dipstick  Type II or unspecified type diabetes mellitus without mention of complication, not stated as uncontrolled - Plan: Comprehensive metabolic panel, Hemoglobin A1c, Microalbumin /  creatinine urine ratio  Pure hypercholesterolemia - Plan: Lipid panel  Other malaise and fatigue - Plan: TSH, Vitamin D 25 hydroxy, CBC with Differential  Need for Tdap vaccination - Plan: Tdap vaccine greater than or equal to 7yo IM  Discussed monthly self breast exams and yearly mammograms after the age of 70; at least 30 minutes of aerobic activity at least 5 days/week; proper sunscreen use reviewed; healthy diet, including goals of calcium and vitamin D intake and alcohol recommendations (less than or equal to 1 drink/day) reviewed; regular seatbelt use; changing batteries in smoke detectors.  Immunization recommendations discussed--TdaP today.  Declines pneumovax.  Flu shots yearly.  Colonoscopy recommendations reviewed, UTD, due again 2016  Yearly eye exam due to DM Pap next year (along with HPV) Schedule mammogram Colonoscopy due again in 2016 (h/o adenomatous colon polyps)  Expect high lipids, to restart lipitor, and schedule f/u labs.

## 2013-03-18 ENCOUNTER — Other Ambulatory Visit: Payer: Self-pay | Admitting: *Deleted

## 2013-03-18 DIAGNOSIS — E7800 Pure hypercholesterolemia, unspecified: Secondary | ICD-10-CM

## 2013-03-18 DIAGNOSIS — Z79899 Other long term (current) drug therapy: Secondary | ICD-10-CM

## 2013-03-18 LAB — MICROALBUMIN / CREATININE URINE RATIO
Creatinine, Urine: 22.9 mg/dL
Microalb Creat Ratio: 21.8 mg/g (ref 0.0–30.0)

## 2013-03-18 LAB — VITAMIN D 25 HYDROXY (VIT D DEFICIENCY, FRACTURES): Vit D, 25-Hydroxy: 31 ng/mL (ref 30–89)

## 2013-03-18 MED ORDER — ATORVASTATIN CALCIUM 20 MG PO TABS: 20.0000 mg | ORAL_TABLET | Freq: Every day | ORAL | Status: DC

## 2013-06-03 ENCOUNTER — Other Ambulatory Visit: Payer: Self-pay

## 2013-06-17 ENCOUNTER — Other Ambulatory Visit: Payer: BC Managed Care – PPO

## 2013-06-17 DIAGNOSIS — Z79899 Other long term (current) drug therapy: Secondary | ICD-10-CM

## 2013-06-17 DIAGNOSIS — E7800 Pure hypercholesterolemia, unspecified: Secondary | ICD-10-CM

## 2013-06-17 LAB — LIPID PANEL
Cholesterol: 209 mg/dL — ABNORMAL HIGH (ref 0–200)
Triglycerides: 114 mg/dL (ref <150)

## 2013-06-17 LAB — HEPATIC FUNCTION PANEL
ALT: 18 U/L (ref 0–35)
AST: 19 U/L (ref 0–37)
Alkaline Phosphatase: 74 U/L (ref 39–117)
Indirect Bilirubin: 0.3 mg/dL (ref 0.0–0.9)
Total Protein: 6.3 g/dL (ref 6.0–8.3)

## 2013-10-27 ENCOUNTER — Encounter: Payer: Self-pay | Admitting: Family Medicine

## 2013-11-15 ENCOUNTER — Telehealth: Payer: Self-pay | Admitting: Family Medicine

## 2013-11-15 NOTE — Telephone Encounter (Signed)
Tried to call pt all contact numbers are invalid. Pt needs to schedule a appt per Dr. Lynelle Doctor.

## 2014-07-18 ENCOUNTER — Ambulatory Visit (INDEPENDENT_AMBULATORY_CARE_PROVIDER_SITE_OTHER): Payer: BC Managed Care – PPO | Admitting: Family Medicine

## 2014-07-18 ENCOUNTER — Encounter: Payer: Self-pay | Admitting: Family Medicine

## 2014-07-18 VITALS — BP 128/78 | HR 76 | Ht 64.0 in | Wt 224.0 lb

## 2014-07-18 DIAGNOSIS — E7800 Pure hypercholesterolemia, unspecified: Secondary | ICD-10-CM

## 2014-07-18 DIAGNOSIS — E041 Nontoxic single thyroid nodule: Secondary | ICD-10-CM

## 2014-07-18 DIAGNOSIS — E119 Type 2 diabetes mellitus without complications: Secondary | ICD-10-CM

## 2014-07-18 LAB — COMPREHENSIVE METABOLIC PANEL
ALBUMIN: 4.2 g/dL (ref 3.5–5.2)
ALT: 14 U/L (ref 0–35)
AST: 15 U/L (ref 0–37)
Alkaline Phosphatase: 80 U/L (ref 39–117)
BUN: 19 mg/dL (ref 6–23)
CALCIUM: 9.7 mg/dL (ref 8.4–10.5)
CHLORIDE: 106 meq/L (ref 96–112)
CO2: 27 meq/L (ref 19–32)
CREATININE: 0.89 mg/dL (ref 0.50–1.10)
GLUCOSE: 95 mg/dL (ref 70–99)
POTASSIUM: 4.1 meq/L (ref 3.5–5.3)
Sodium: 141 mEq/L (ref 135–145)
Total Bilirubin: 0.3 mg/dL (ref 0.2–1.2)
Total Protein: 6.1 g/dL (ref 6.0–8.3)

## 2014-07-18 LAB — LIPID PANEL
CHOLESTEROL: 235 mg/dL — AB (ref 0–200)
HDL: 59 mg/dL (ref >39)
LDL Cholesterol: 155 mg/dL — ABNORMAL HIGH (ref 0–99)
TRIGLYCERIDES: 103 mg/dL (ref <150)
Total CHOL/HDL Ratio: 4 Ratio
VLDL: 21 mg/dL (ref 0–40)

## 2014-07-18 LAB — TSH: TSH: 2.18 u[IU]/mL (ref 0.350–4.500)

## 2014-07-18 LAB — HEMOGLOBIN A1C
HEMOGLOBIN A1C: 6.2 % — AB (ref <5.7)
Mean Plasma Glucose: 131 mg/dL — ABNORMAL HIGH (ref <117)

## 2014-07-18 MED ORDER — ATORVASTATIN CALCIUM 20 MG PO TABS: 20.0000 mg | ORAL_TABLET | Freq: Every day | ORAL | Status: DC

## 2014-07-18 NOTE — Progress Notes (Signed)
Chief Complaint  Patient presents with  . Hyperlipidemia    fasting med check.    She is moving to [place], [place] next week.  Mammogram is scheduled for next week. She presents for follow up on cholesterol and blood sugars, and get refills prior to moving. She doesn't have any specific complaints.  She remains very active, exercising regularly, but not losing any weight.  Hyperlipidemia follow-up:  Patient is reportedly following a low-fat, low cholesterol diet.  Compliant with medications and denies medication side effects.  DM--diet controlled, never on medication (diagnosed based on A1c of 6.5 in the past). Watching carbs/sweets in her diet. Last ophtho was April 2014. Denies polydipsia or polyuria.  Her weight has been stable (gains some in the summer, loses in the winter).  Walks 3-4 days/week, and does Zumba 2x/wk.  Snacks on nuts  Past Medical History  Diagnosis Date  . Allergy   . Hyperlipidemia   . Impaired fasting glucose   . Diabetes mellitus 09/2009  . Adenomatous colon polyp 12/2009  . Hypertension borderline bp's/white coat syndrome   Past Surgical History  Procedure Laterality Date  . Tubal ligation  1995  . Colonoscopy  12/2009   History   Social History  . Marital Status: Married    Spouse Name: N/A    Number of Children: 2  . Years of Education: N/A   Occupational History  . 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 Smoking status: Former Smoker    Types: Cigarettes    Quit date: 12/24/1991  . Smokeless tobacco: Not on file  . Alcohol Use: Yes     Comment: 1-2 drinks every few months  . Drug Use: No  . Sexual Activity: Yes    Partners: Male   Other Topics Concern  . Not on file   Social History Narrative   Retired Theme park manager.  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  Lives with her husband.  Daughter lives in [place], son in college in [place] to  [place], [place] in August 2015    Outpatient Encounter Prescriptions as of 07/18/2014  Medication Sig  . atorvastatin (LIPITOR) 20 MG tablet Take 1 tablet (20 mg total) by mouth daily.  . Calcium Carbonate-Vitamin D (CALCIUM 500 + D PO) Take 1 tablet by mouth daily.    . fish oil-omega-3 fatty acids 1000 MG capsule Take 1 g by mouth daily.    . Misc Natural Products (PROGESTERONE) 1000 MG/60GM CREA Apply 1 application topically daily.    . Multiple Vitamin (MULTIVITAMIN) tablet Take 1 tablet by mouth daily.  . [DISCONTINUED] atorvastatin (LIPITOR) 20 MG tablet Take 1 tablet (20 mg total) by mouth daily.  . [DISCONTINUED] Vitamins C E (VITAMIN C & E COMBINATION) 500-400 MG-UNIT CAPS Take 2 tablets by mouth daily.    . Naproxen Sodium (ALEVE PO) Take 2 tablets by mouth as needed.    Allergies  Allergen Reactions  . Codeine Itching and Other (See Comments)    aggitation.   ROS: denies headaches, dizziness, URI symptoms, chest pain, palpitations, nausea, vomiting, abdominal pain, diarrhea, bowel changes, urinary complaints, bleeding, bruising, rashes.  Some edema today due to long car ride (just got back from [place] this morning). Denies depression, anxiety, insomnia. Denies hair/skin/bowel changes.  PHYSICAL EXAM: BP 128/78  Pulse 76  Ht $R'5\' 4"'YT$  (1.626 m)  Wt 224 lb (101.606 kg)  BMI 38.43 kg/m2  Well developed, pleasant, obese female in no distress HEENT:  PERRL, EOMI, conjunctiva clear.  OP clear Neck: no lymphadenopathy.  Thyroid--palpable nodule on right side noted, nontender Heart: regular rate and rhythm without murmur Lungs: clear bilaterally Back: no CVA tenderness Abdomen: soft, nontender, no organomegaly or mass Extremities: no edema, 2+ pulse.  Normal monofilament exam. Neuro: alert and oriented.  Cranial nerves intact. Normal strength, gait Psych: normal mood, affect, hygiene and grooming Skin: no lesions/rashes  ASSESSMENT/PLAN:  Pure hypercholesterolemia - continue low  cholesterol, lowfat diet.  - Plan: Lipid panel, Comprehensive metabolic panel, atorvastatin (LIPITOR) 20 MG tablet  Type II or unspecified type diabetes mellitus without mention of complication, not stated as uncontrolled - diet controlled (never on meds).  continue exercise; weight loss encouraged.  schedule yearly eye exam - Plan: Comprehensive metabolic panel, Microalbumin / creatinine urine ratio, Hemoglobin A1c, HM Diabetes Foot Exam  Thyroid nodule - discussed differential dx. check ultrasound - Plan: TSH, US Soft Tissue Head/Neck  Counseled extensively re: diet and weight loss (that her current level of exercise is keeping her steady at current weight; needs to do something different re: diet/exercise in order to LOSE).  Counseled re: healthy snacks, portion control--eating more fruits/vegetables, portions of nuts, etc.  c-met, lipids, A1c, urine microalbumin today  Pneumovax is recommended. Declines today.  Will consider and call back for nurse visit if interested. Encouraged due to her dx of DM (and would then not need another until age 57 when she would get Prevnar-13)  Will contact with lab and ultrasound results when available.   Encouraged her to find new PCP, get records sent and establish in [place] within 6 months for a CPE

## 2014-07-18 NOTE — Patient Instructions (Addendum)
Try and adjust your diet as we discussed--more vegetables and fruits, cut back servings/portion sizes of nuts.  Limit carb portions as well.  Continue exercise.  Try and lose weight.  Continue your current medications.  We will be in touch with your lab results--sign up for MyChart to make it easier to reach [place] (and [place] reaching you).  I recommend that you get a pneumonia vaccine.  Please reconsider and call for nurse visit if interested.  We are going to send you for a thyroid ultrasound to evaluate a possible nodule on the right side.  You are past due for a routine physical.  Try and establish care with a new primary doctor in [place] and have your physical within the next 6 months.

## 2014-07-19 LAB — MICROALBUMIN / CREATININE URINE RATIO
CREATININE, URINE: 46.8 mg/dL
MICROALB UR: 0.5 mg/dL (ref 0.00–1.89)
MICROALB/CREAT RATIO: 10.7 mg/g (ref 0.0–30.0)

## 2014-07-20 ENCOUNTER — Other Ambulatory Visit: Payer: Self-pay | Admitting: *Deleted

## 2014-07-20 ENCOUNTER — Ambulatory Visit
Admission: RE | Admit: 2014-07-20 | Discharge: 2014-07-20 | Disposition: A | Discharge status: Home / Self Care | Payer: BC Managed Care – PPO | Source: Ambulatory Visit | Attending: Family Medicine | Admitting: Family Medicine

## 2014-07-20 DIAGNOSIS — E041 Nontoxic single thyroid nodule: Secondary | ICD-10-CM

## 2014-07-20 DIAGNOSIS — E7800 Pure hypercholesterolemia, unspecified: Secondary | ICD-10-CM

## 2014-07-20 MED ORDER — ATORVASTATIN CALCIUM 20 MG PO TABS: 20.0000 mg | ORAL_TABLET | Freq: Every day | ORAL | Status: AC

## 2014-07-21 ENCOUNTER — Other Ambulatory Visit: Payer: Self-pay | Admitting: *Deleted

## 2014-07-21 DIAGNOSIS — E041 Nontoxic single thyroid nodule: Secondary | ICD-10-CM

## 2014-07-26 ENCOUNTER — Ambulatory Visit
Admission: RE | Admit: 2014-07-26 | Discharge: 2014-07-26 | Disposition: A | Discharge status: Home / Self Care | Payer: BC Managed Care – PPO | Source: Ambulatory Visit | Attending: Family Medicine | Admitting: Family Medicine

## 2014-07-26 ENCOUNTER — Other Ambulatory Visit (HOSPITAL_COMMUNITY)
Admission: RE | Admit: 2014-07-26 | Discharge: 2014-07-26 | Disposition: A | Discharge status: Home / Self Care | Payer: BC Managed Care – PPO | Source: Ambulatory Visit | Attending: Interventional Radiology | Admitting: Interventional Radiology

## 2014-07-26 DIAGNOSIS — E041 Nontoxic single thyroid nodule: Secondary | ICD-10-CM

## 2014-07-29 LAB — HM MAMMOGRAPHY

## 2014-09-05 ENCOUNTER — Encounter: Payer: Self-pay | Admitting: Family Medicine

## 2014-09-08 ENCOUNTER — Encounter: Payer: Self-pay | Admitting: Internal Medicine

## 2015-06-19 ENCOUNTER — Other Ambulatory Visit: Payer: Self-pay

## 2016-02-09 IMAGING — US US SOFT TISSUE HEAD/NECK
1 series · 13 of 25 positions shown · non-contrast
Comparison: None.

CLINICAL DATA: Palpable right-sided thyroid nodule.

EXAM:
THYROID ULTRASOUND
TECHNIQUE: Ultrasound examination of the thyroid gland and adjacent soft
tissues was performed.

[Series 1: us soft tissue head/neck · 0.10mm/px · 13 of 78 slices shown]
[im 1/78]
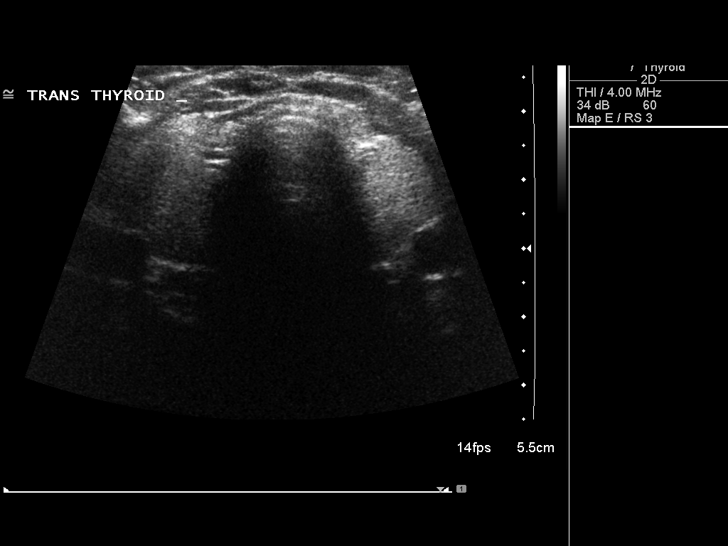
[im 7/78]
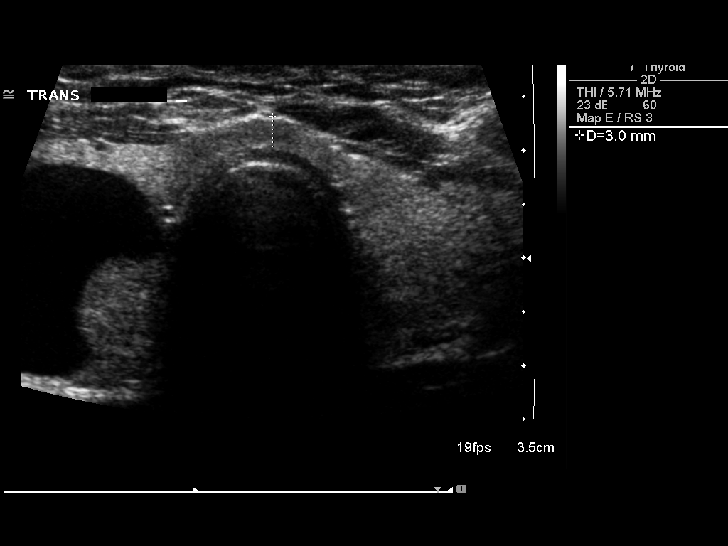
[im 13/78]
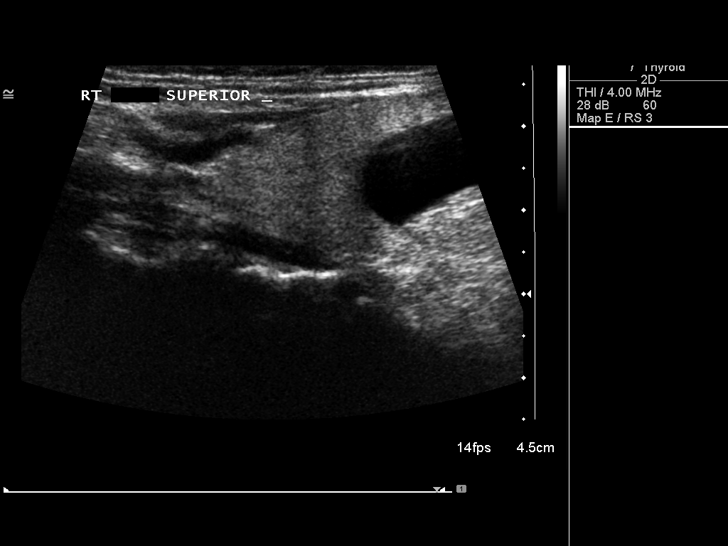
[im 20/78]
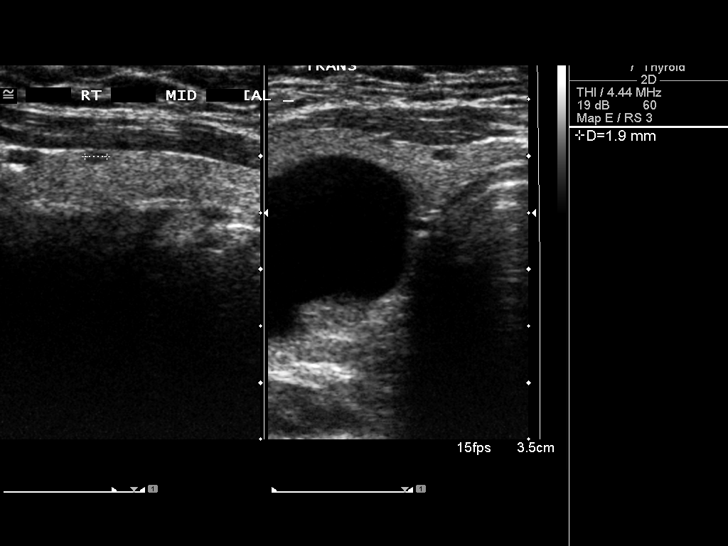
[im 26/78]
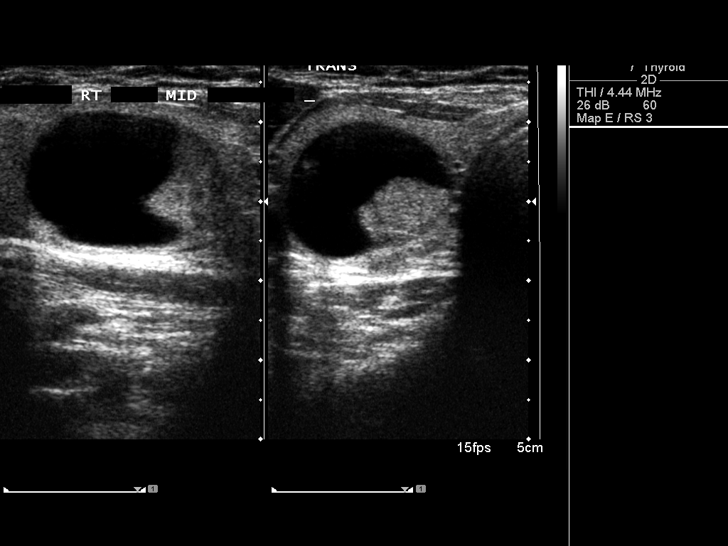
[im 33/78]
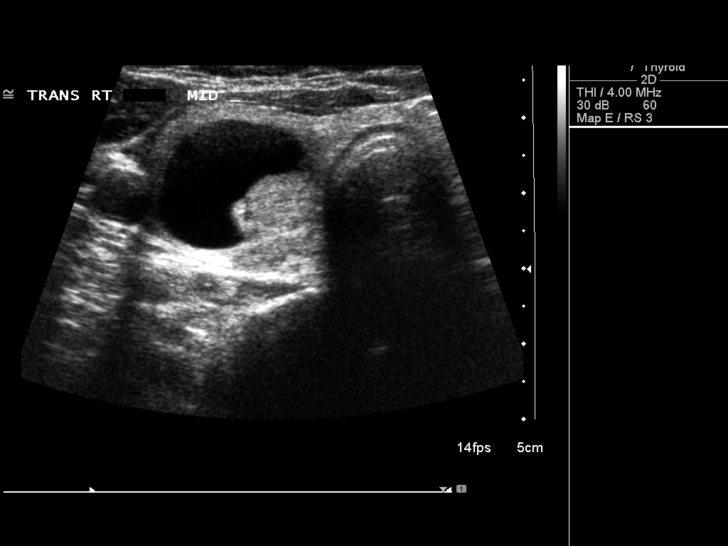
[im 39/78]
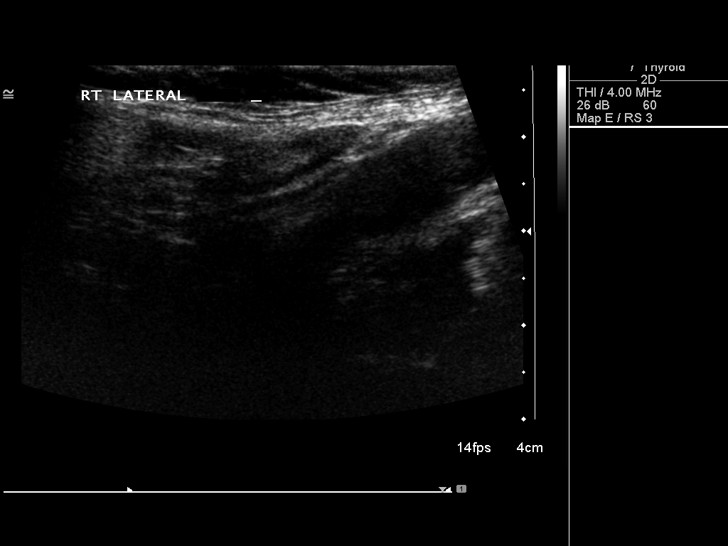
[im 45/78]
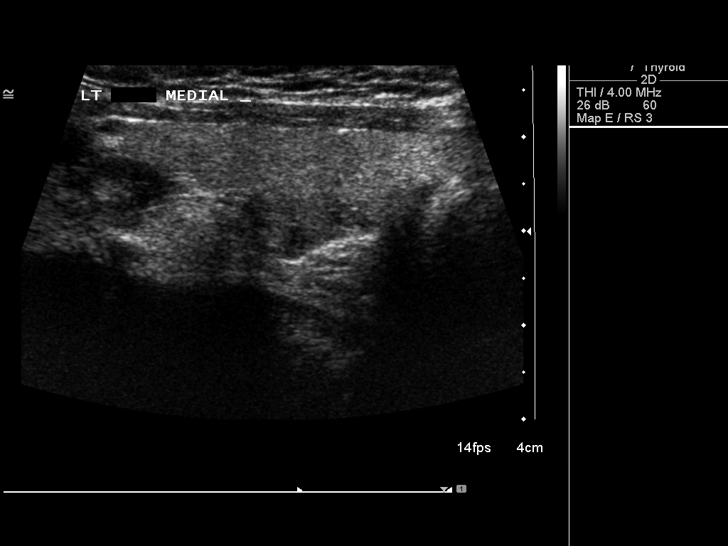
[im 52/78]
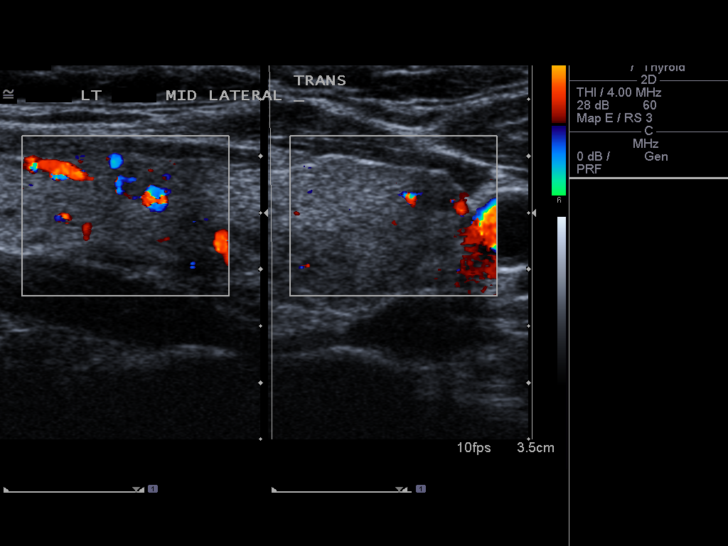
[im 58/78]
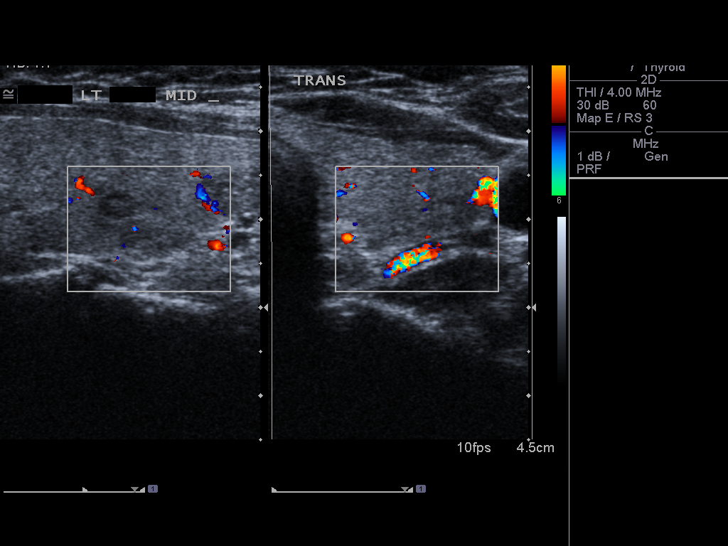
[im 65/78]
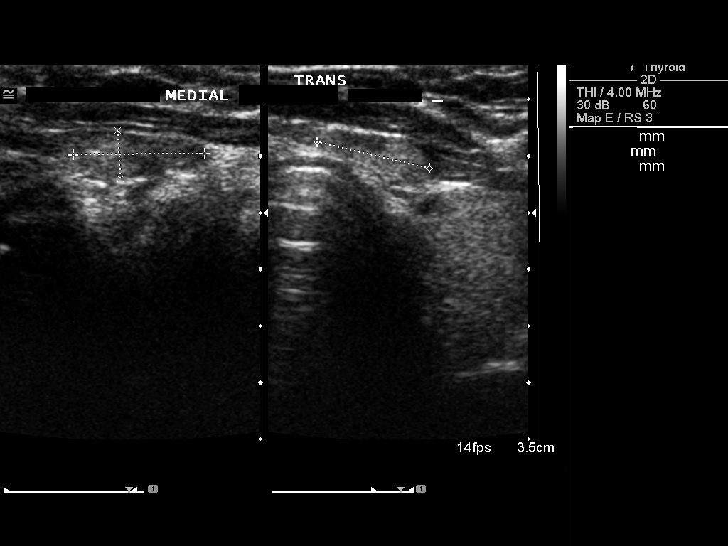
[im 71/78]
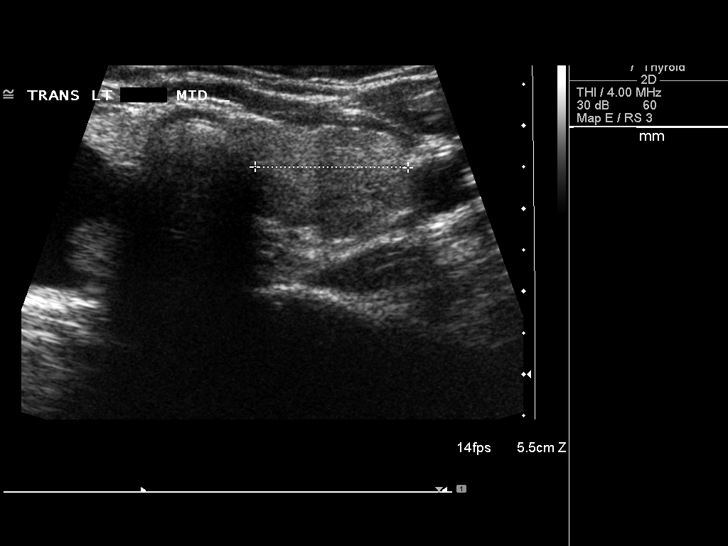
[im 78/78]
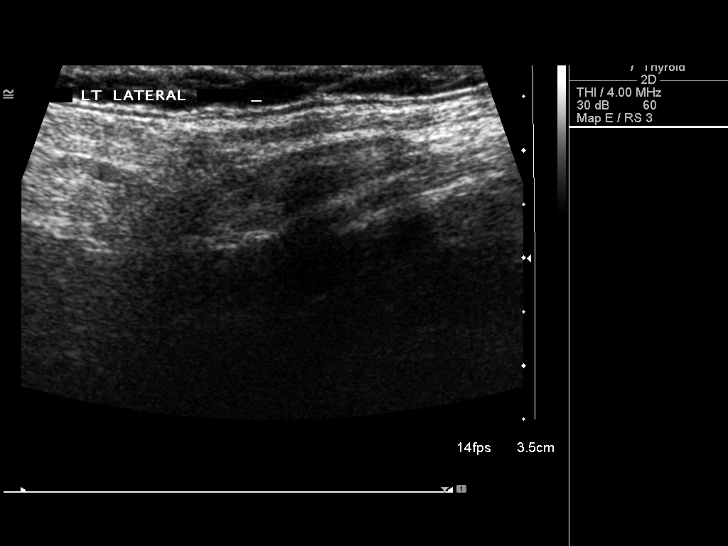

[13 of 25 positions shown; findings below may reference images not displayed]

FINDINGS: Right thyroid lobe

Measurements: Normal in size measuring 5.0 x 2.4 x 2.5 cm.

*Right, mid/inferior - 2.7 x 1.8 x 2.2 cm - predominately anechoic
and cystic with peripheral echogenic nodule. The solid component
demonstrates internal blood flow.

Right, mid, medial - 0.2 cm - anechoic, likely cystic

Right, mid, medial - 0.5 x 0.3 x 0.4 cm - hypoechoic, solid.

Right, inferior, posterior- 1.6 x 0.7 x 1.1 cm - ill-defined,
possibly a pseudo nodule an representative of normal thyroid
parenchymal displaced by the adjacent dominant thyroid nodule.

Left thyroid lobe

Measurements: Normal in size measuring 4.8 x 1.6 x 1.8 cm.

Left, superior - 0.5 x 0.3 x 0.4 cm - isoechoic, possibly a pseudo
nodule.

Left, mid, lateral - 0.6 x 0.4 x 0.5 cm - hypoechoic, solid

Left, mid - 0.5 x 0.3 x 0.5 cm - mixed echogenic, solid.

Left, mid, posterior - 0.7 x 0.4 x 0.5 cm - mixed echogenic,
ill-defined, possibly a pseudo nodule.

Left, inferior - 0.6 x 0.5 x 0.6 cm - mixed echogenic, ill-defined,
possibly a pseudo nodule.

Left, inferior - 0.3 cm - hypoechoic, ill-defined, possibly a pseudo
nodule.

Isthmus

Thickness: Normal in size measuring 0.3 cm in diameter..

Superior, left, exophytic - 1.2 x 0.4 x 1.0 cm - mixed echogenic,
solid

Lymphadenopathy

None visualized.
IMPRESSION: Findings compatible with multi nodular goiter. The dominant
approximately 2.7 cm partially solid though predominantly cystic
nodule within the right lobe of the thyroid meets imaging criteria
to recommend percutaneous sampling and may correlate with the
patient's palpable area of concern. This recommendation follows the
consensus statement: Management of Thyroid Nodules Detected at US:
Society of Radiologists in Ultrasound Consensus Conference

## 2016-02-15 IMAGING — US US THYROID BIOPSY
1 series · 13 of 15 positions shown · non-contrast
Comparison: Prior thyroid ultrasound 07/20/2014

CLINICAL DATA: 58-year-old female with complex cystic and solid
right thyroid nodule which meet consensus criteria for
ultrasound-guided FNA biopsy and aspiration.

EXAM:
ULTRASOUND GUIDED NEEDLE ASPIRATE BIOPSY OF THE THYROID GLAND

[Series 1: us thyroid biopsy · 0.07mm/px · 15 acquisitions, 13 frames shown]
[im 1/15]
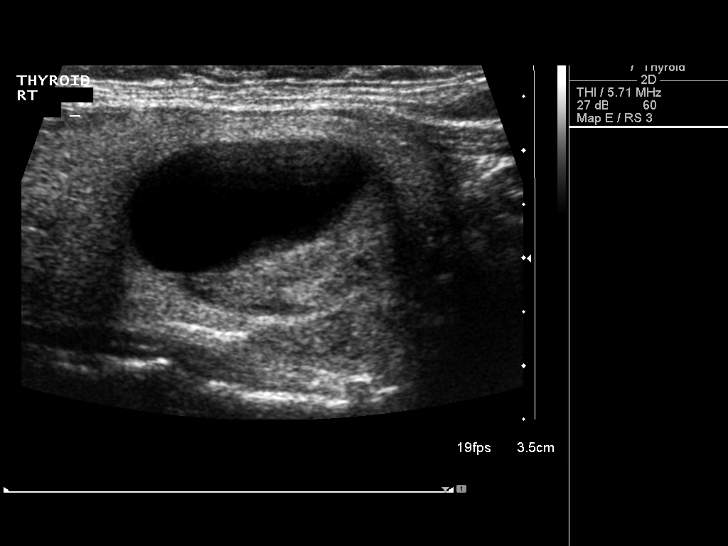
[im 2/15]
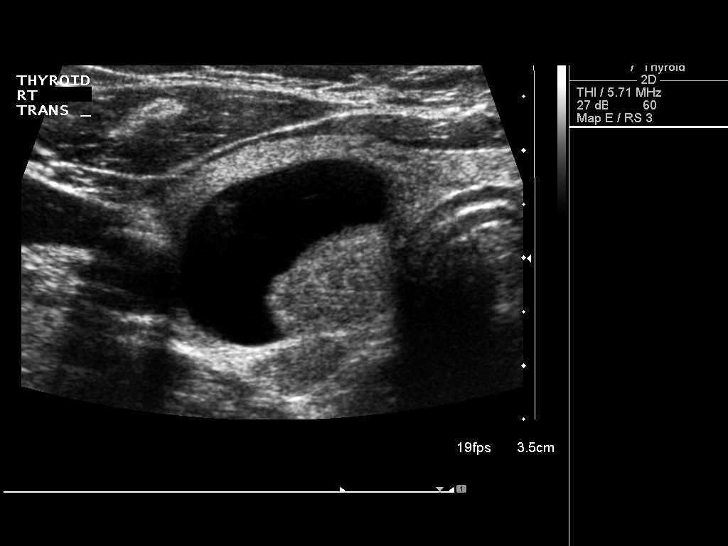
[im 3/15]
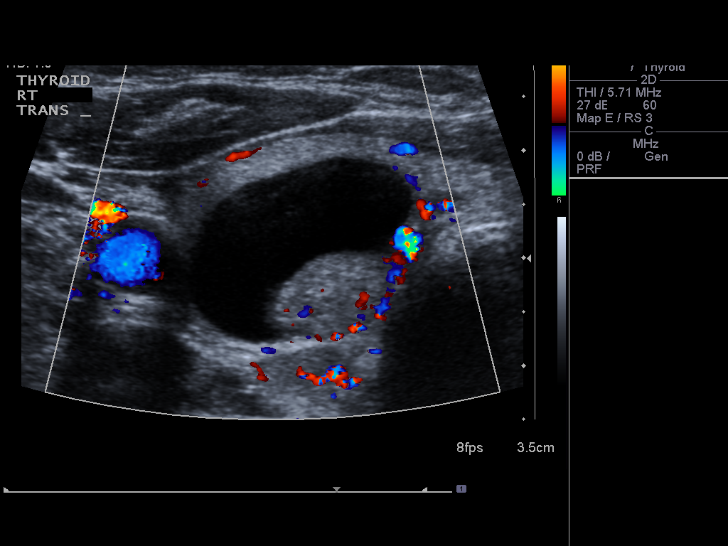
[im 5/15]
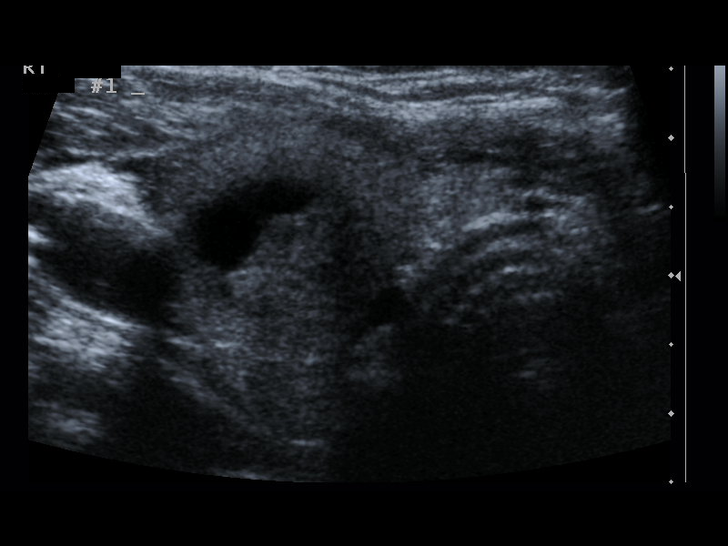
[im 6/15]
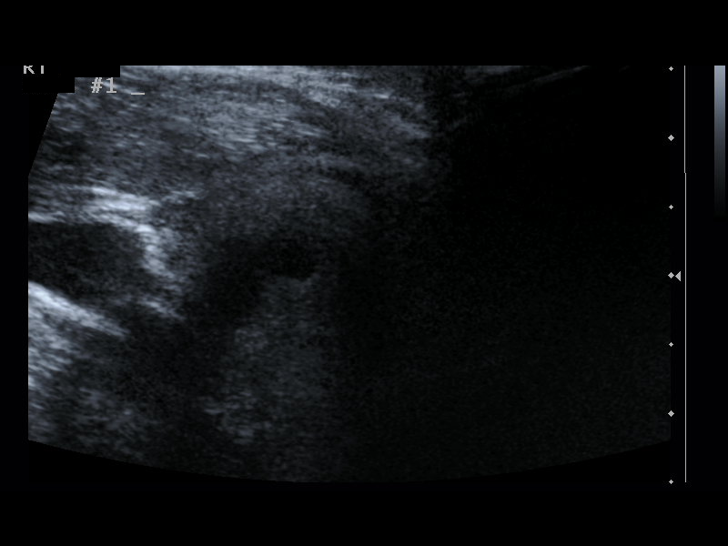
[im 7/15]
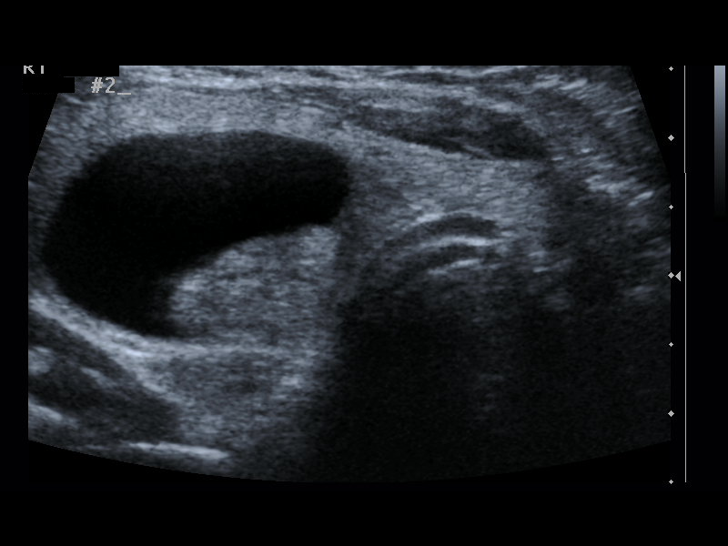
[im 8/15]
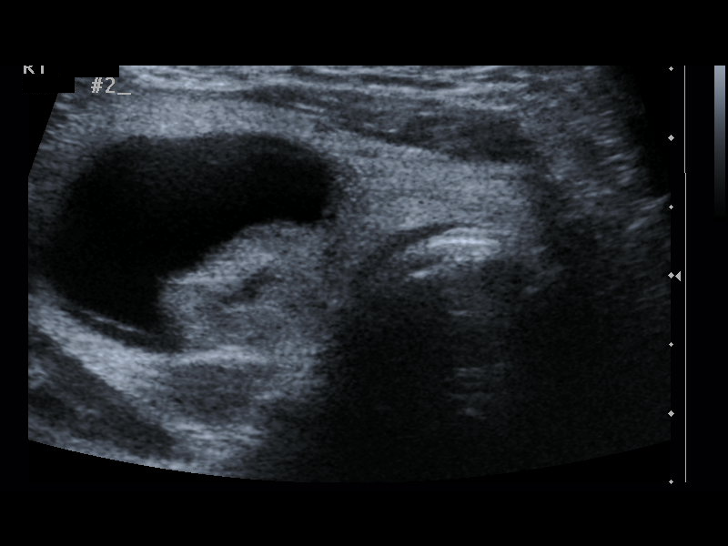
[im 9/15]
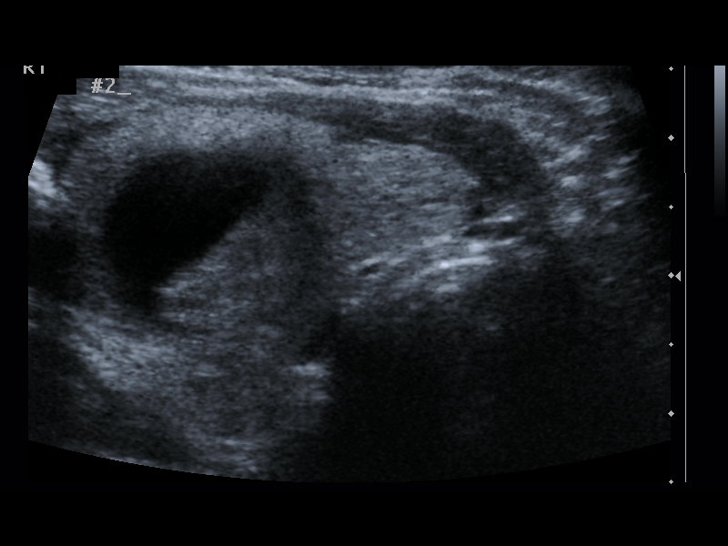
[im 10/15]
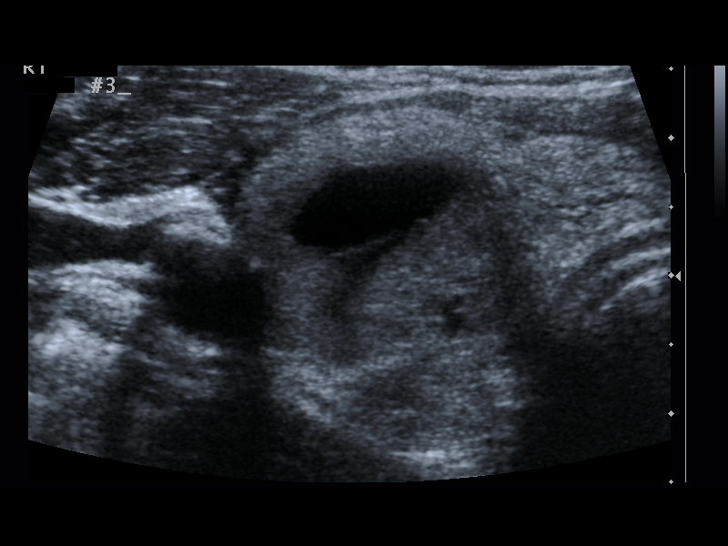
[im 11/15]
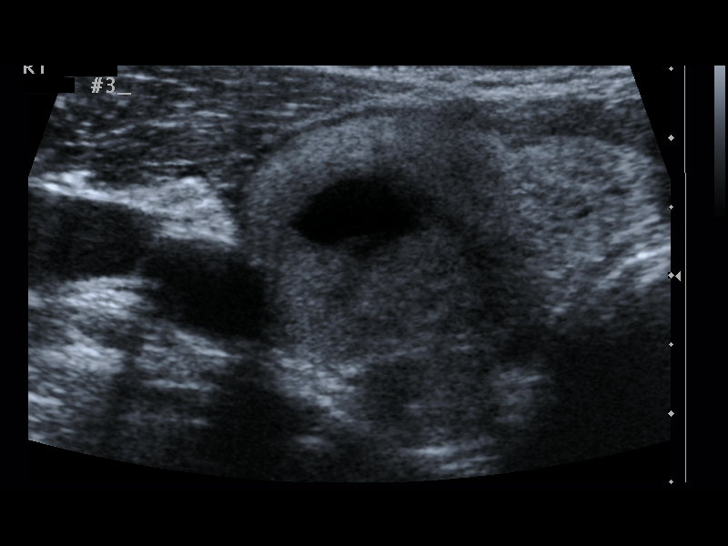
[im 13/15]
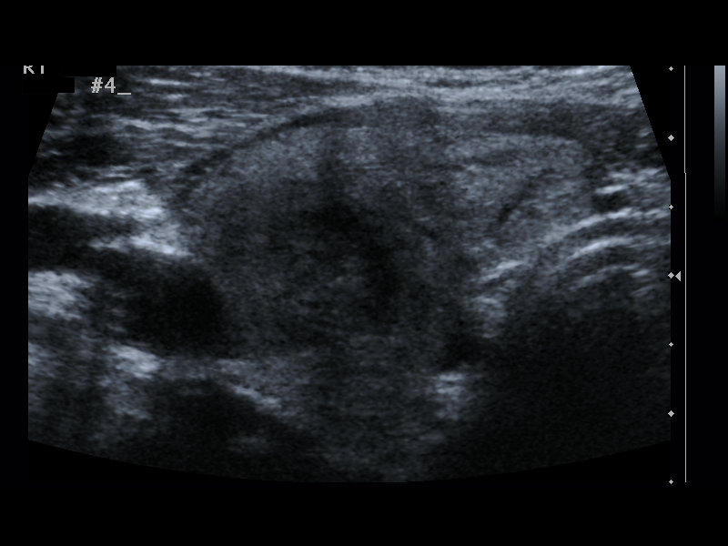
[im 14/15]
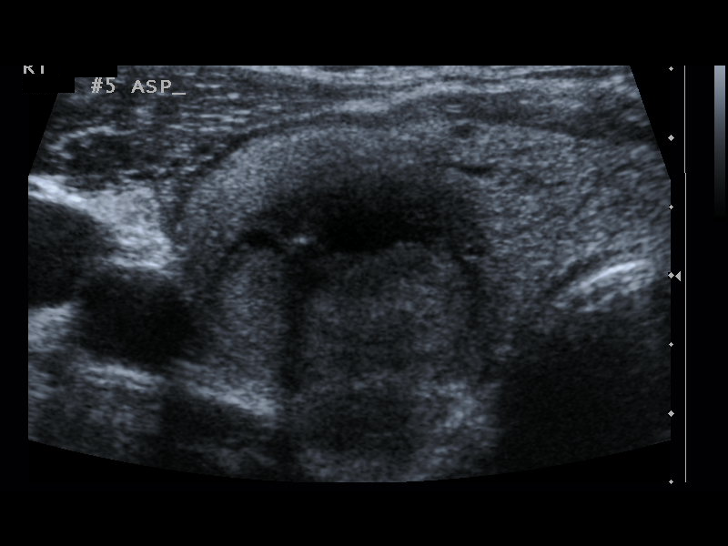
[im 15/15]
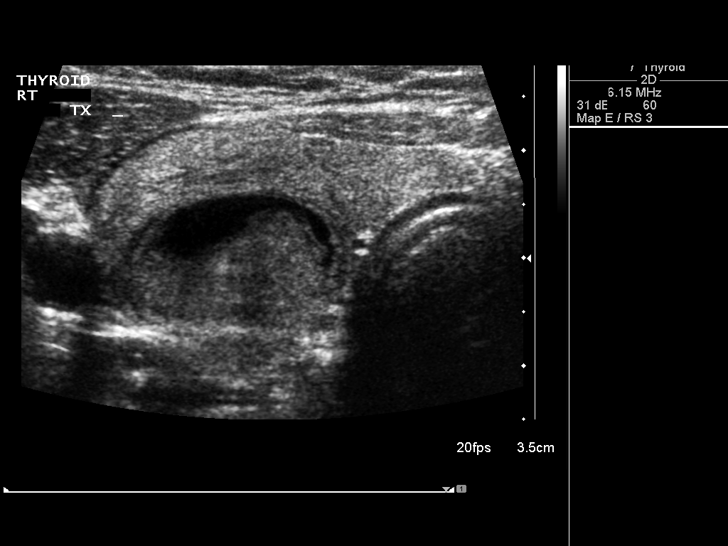

[13 of 15 positions shown; findings below may reference images not displayed]

PROCEDURE:
Thyroid biopsy was thoroughly discussed with the patient and
questions were answered. The benefits, risks, alternatives, and
complications were also discussed. The patient understands and
wishes to proceed with the procedure. Written consent was obtained.

Ultrasound was performed to localize and mark an adequate site for
the biopsy. The patient was then prepped and draped in a normal
sterile fashion. Local anesthesia was provided with 1% lidocaine.
Using direct ultrasound guidance, 4 passes were made using needles
into the nodule within the right lobe of the thyroid. An additional
pass with an 18 gauge needle was made. Approximately 4 cc of
hemorrhagic cyst contents were successfully aspirated. Ultrasound
was used to confirm needle placements on all occasions. Specimens
were sent to Pathology for analysis.

Complications:  None
FINDINGS: 2.7 cm complex cystic and solid nodule in the right thyroid gland.
IMPRESSION: Ultrasound guided needle aspirate biopsy and aspiration of right
complex cystic/solid thyroid nodule.

## 2022-06-24 ENCOUNTER — Encounter: Payer: Self-pay | Admitting: Family Medicine

## 2022-08-28 ENCOUNTER — Encounter: Payer: Self-pay | Admitting: Internal Medicine
